# Patient Record
Sex: Female | Born: 1985 | Hispanic: Yes | Marital: Single | Smoking: Never smoker
Health system: Southern US, Community
[De-identification: ages and names within clinical notes are randomized; demographics above are authoritative.]

## PROBLEM LIST (undated history)

## (undated) DIAGNOSIS — Z8759 Personal history of other complications of pregnancy, childbirth and the puerperium: Secondary | ICD-10-CM

## (undated) DIAGNOSIS — E119 Type 2 diabetes mellitus without complications: Secondary | ICD-10-CM

## (undated) DIAGNOSIS — D649 Anemia, unspecified: Secondary | ICD-10-CM

## (undated) DIAGNOSIS — F329 Major depressive disorder, single episode, unspecified: Secondary | ICD-10-CM

## (undated) DIAGNOSIS — F32A Depression, unspecified: Secondary | ICD-10-CM

## (undated) HISTORY — DX: Type 2 diabetes mellitus without complications: E11.9

## (undated) HISTORY — DX: Personal history of other complications of pregnancy, childbirth and the puerperium: Z87.59

## (undated) HISTORY — DX: Depression, unspecified: F32.A

## (undated) HISTORY — DX: Anemia, unspecified: D64.9

## (undated) HISTORY — DX: Major depressive disorder, single episode, unspecified: F32.9

---
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
## 2004-05-28 ENCOUNTER — Inpatient Hospital Stay (HOSPITAL_COMMUNITY): Admission: AD | Admit: 2004-05-28 | Discharge: 2004-05-28 | Payer: Self-pay | Admitting: Family Medicine

## 2004-11-30 IMAGING — US US OB COMP +14 WK
1 series · 13 of 28 positions shown · non-contrast
Comparison: none

CLINICAL DATA: Late pregnancy with no prenatal care.  Uncertain LMP with estimated gestational age of 32 weeks 4 days.

[Series 1: unknown · 0.30mm/px · 13 of 57 slices shown]
[im 3/57]
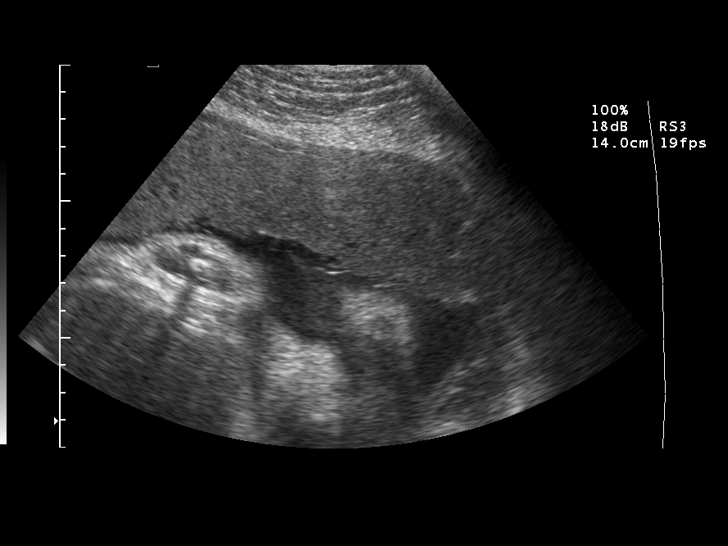
[im 7/57]
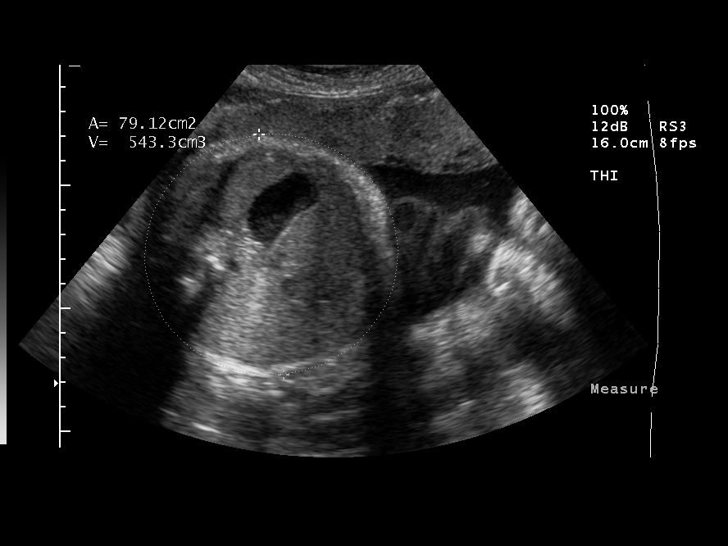
[im 11/57]
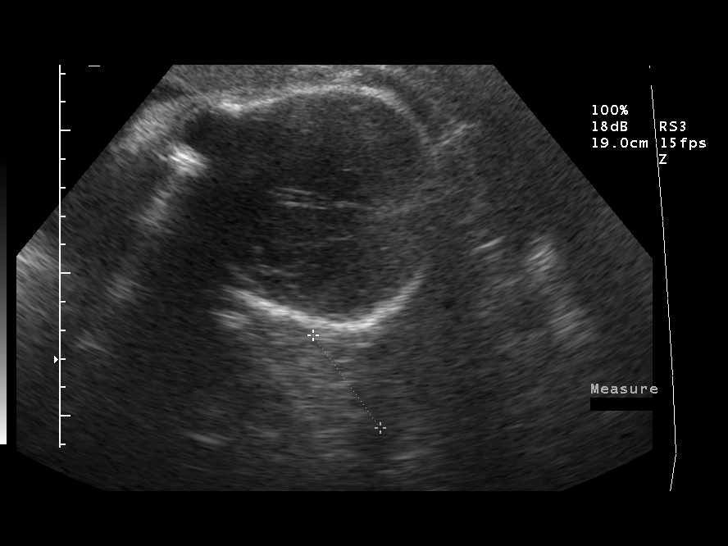
[im 15/57]
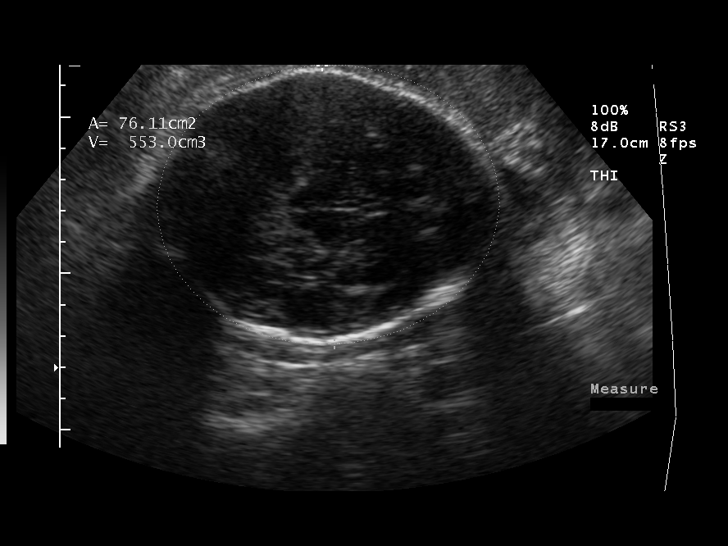
[im 19/57]
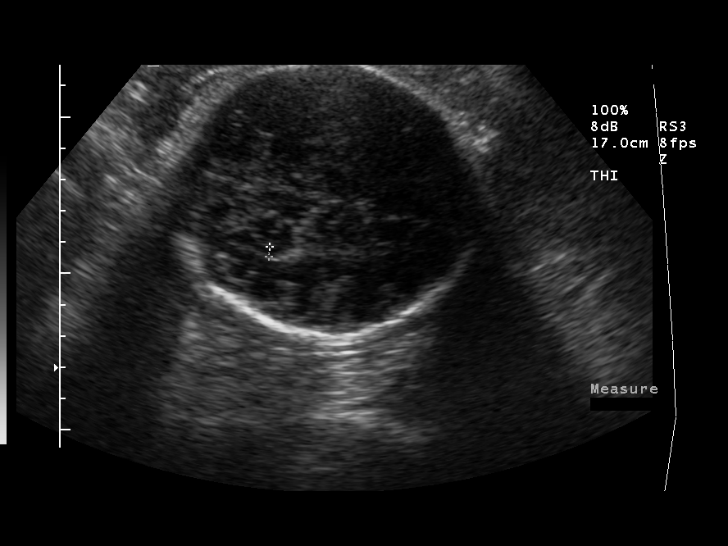
[im 23/57]
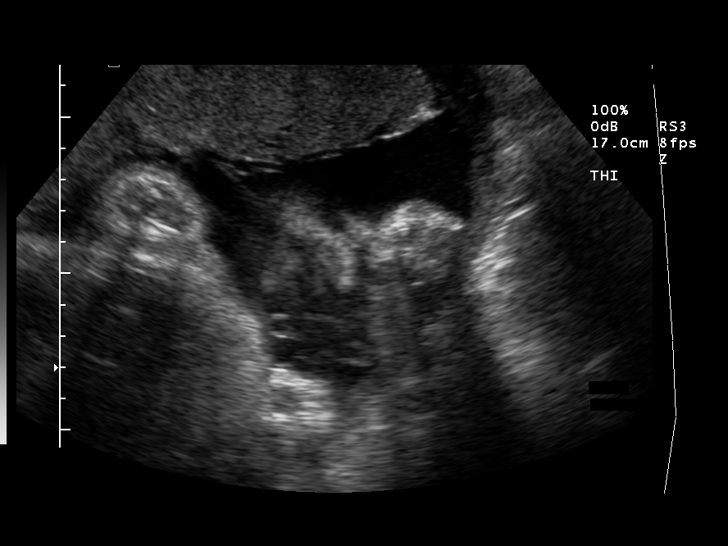
[im 30/57]
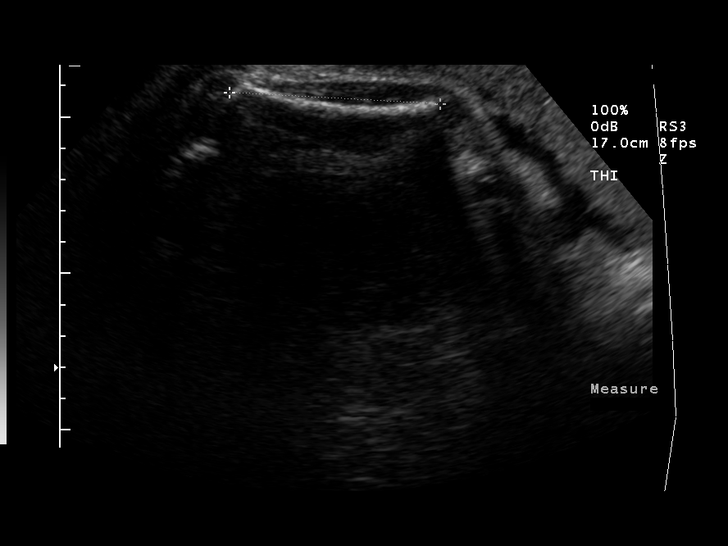
[im 34/57]
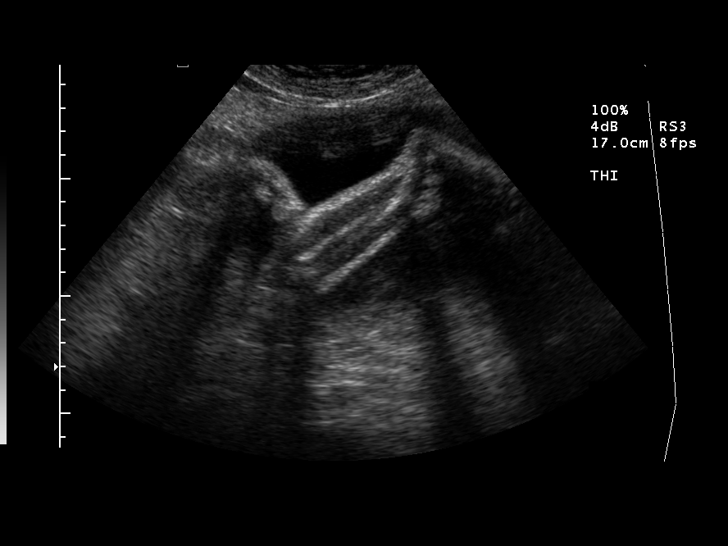
[im 38/57]
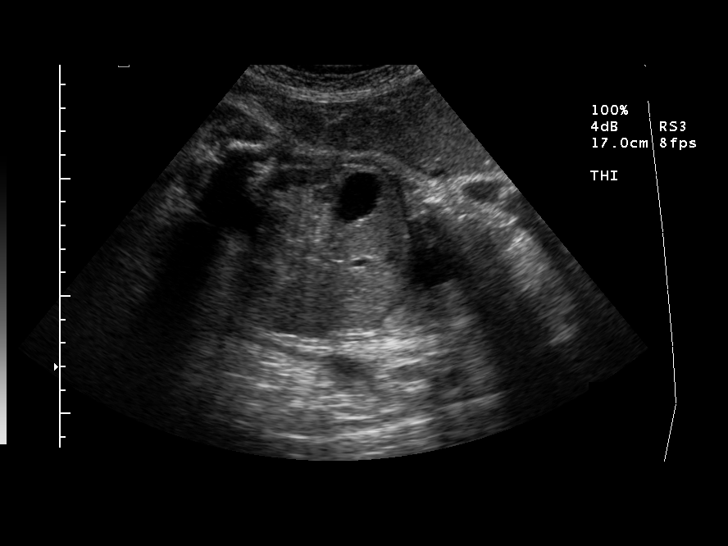
[im 42/57]
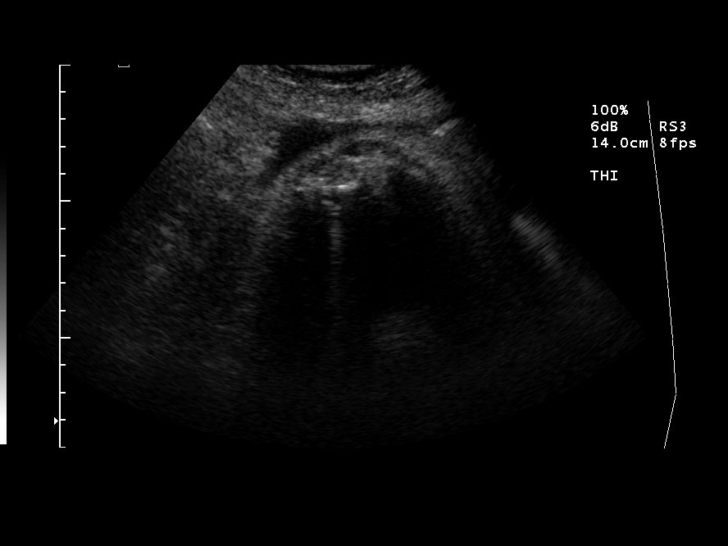
[im 46/57]
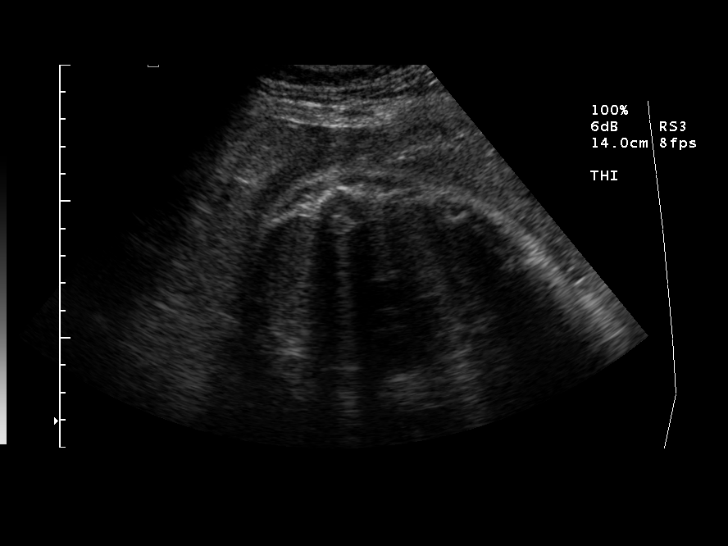
[im 50/57]
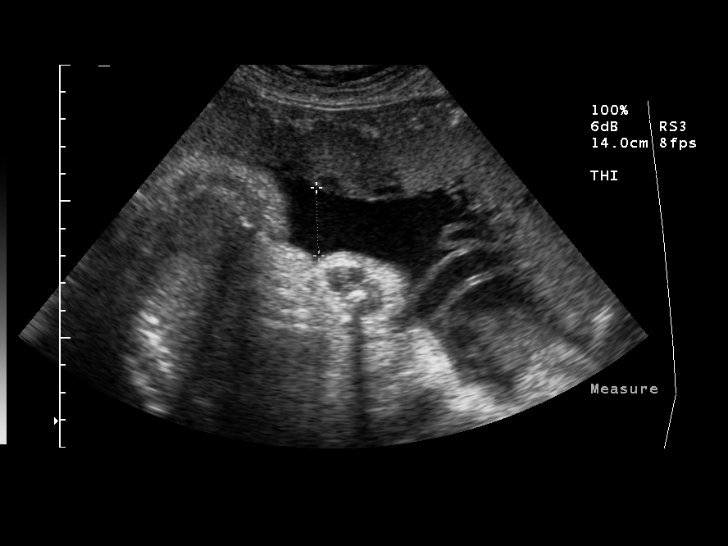
[im 54/57]
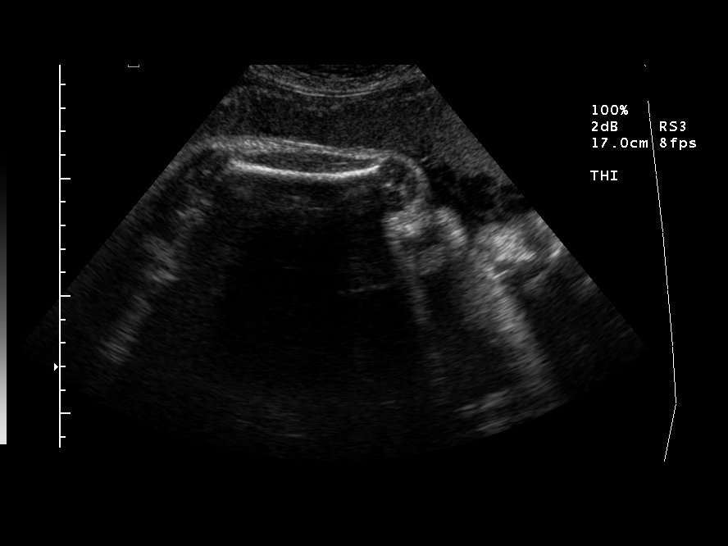

[13 of 28 positions shown; findings below may reference images not displayed]

OBSTETRICAL ULTRASOUND

 NUMBER OF FETUSES:  1
 HEART RATE:  155
 MOVEMENT:  Yes
 BREATHING:  Yes  
 PRESENTATION:  Cephalic
 PLACENTAL LOCATION:  Anterior
 GRADE:  I
 PREVIA:  No
 AMNIOTIC FLUID (SUBJECTIVE):  Normal
 AMNIOTIC FLUID (OBJECTIVE):  10.4 cm AFI (5th - 95th%ile =  7.9 ? 24.9 cm for 35 wks)

 FETAL BIOMETRY
 BPD:  8.6 cm   34 w 6 d
 HC:  31.3 cm  35 w 0 d
 AC:  31.3 cm   35 w 1 d
 FL:  6.7 cm   34 w 4 d
 MEAN GA:    34 w 6 d
 EFW:  0881 g (H) 50th ? 75th%ile (9868 ? 8558 g) for 35 wks

 FETAL ANATOMY
 LATERAL VENTRICLES:  Visualized 
 THALAMI/CSP:      Visualized 
 POSTERIOR FOSSA:  Visualized 
 NUCHAL REGION:  N/A
 SPINE:      Visualized 
 4 CHAMBER HEART ON LEFT:      Not visualized 
 STOMACH ON LEFT:      Visualized 
 3 VESSEL CORD:  Visualized 
 CORD INSERTION SITE:  Not visualized 
 KIDNEYS:  Visualized 
 BLADDER:  Visualized 
 EXTREMITIES:      Visualized except for right upper extremity

 ADDITIONAL ANATOMY VISUALIZED:  Upper lip, orbits, profile, and diaphragm

 EVALUATION LIMITED BY:  Fetal position and advanced gestational age 

 MATERNAL FINDINGS
 CERVIX:  4.0 cm Transabdominally
IMPRESSION: Single living intrauterine fetus with mean gestational age of 34 weeks 6 days and sonographic EDC of 04/14/03.  Initial estimates of gestational age in the third trimester are less accurate.
 Limited anatomic evaluation due to advanced gestational age.  No fetal anatomic abnormality identified.

## 2004-12-29 IMAGING — US US OB FOLLOW-UP
1 series · 18 of 28 positions shown · non-contrast
Comparison: none

CLINICAL DATA: Discordant LMP and prior ultrasound dates.  Assigned gestational age based on prior ultrasound is currently 39 weeks 0 days.  Evaluate fetal growth.

[Series 1: us ob re-eval · 18 of 31 slices shown]
[im 1/31]
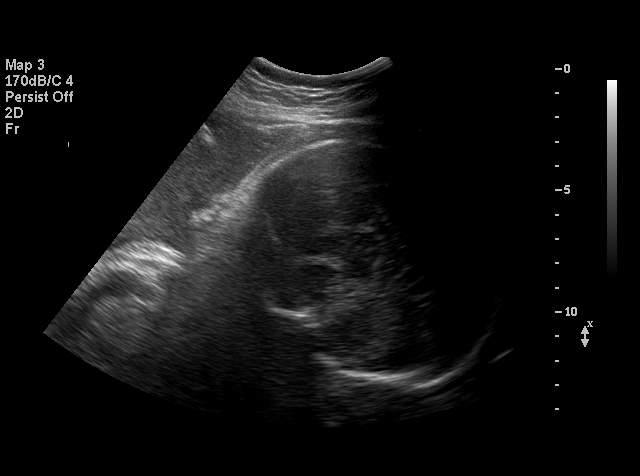
[im 3/31]
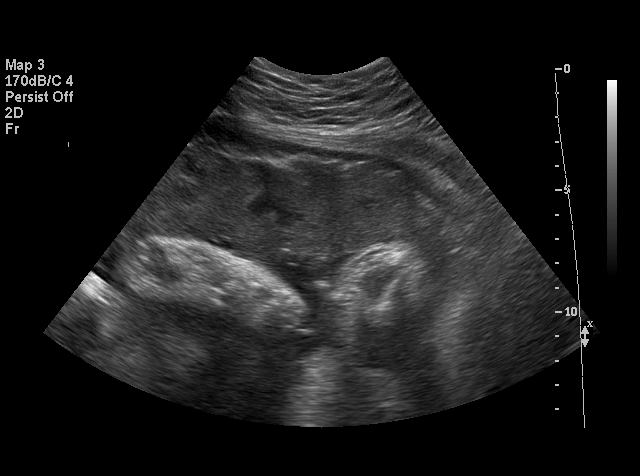
[im 4/31]
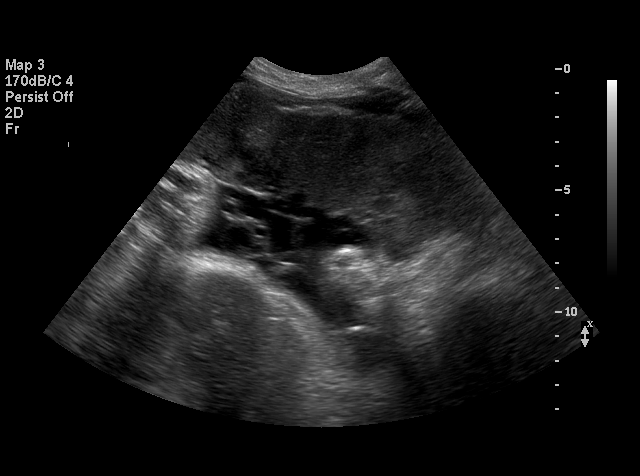
[im 6/31]
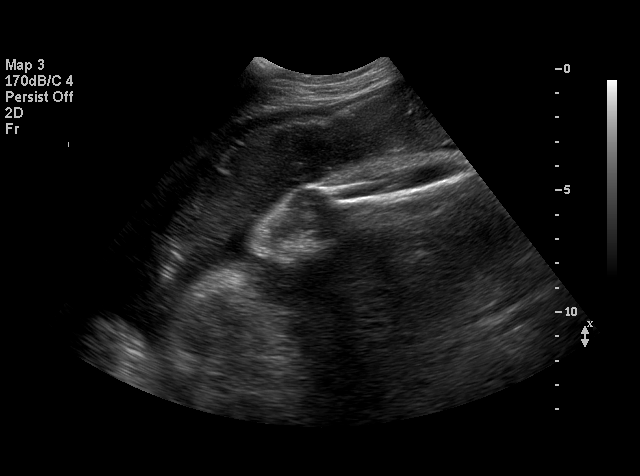
[im 8/31]
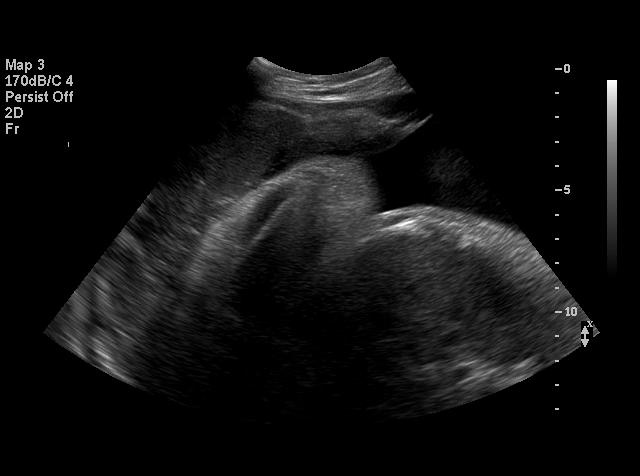
[im 9/31]
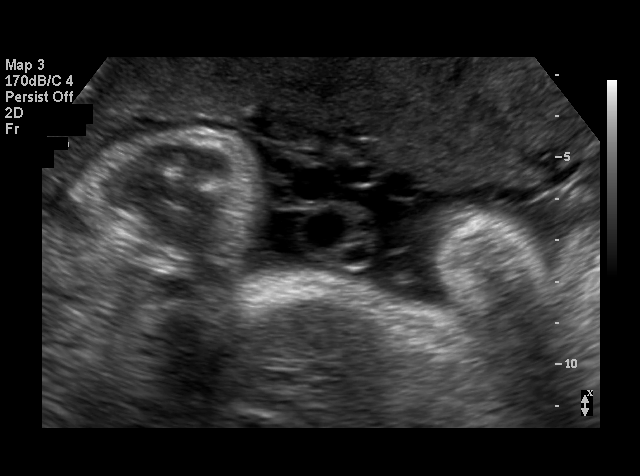
[im 12/31]
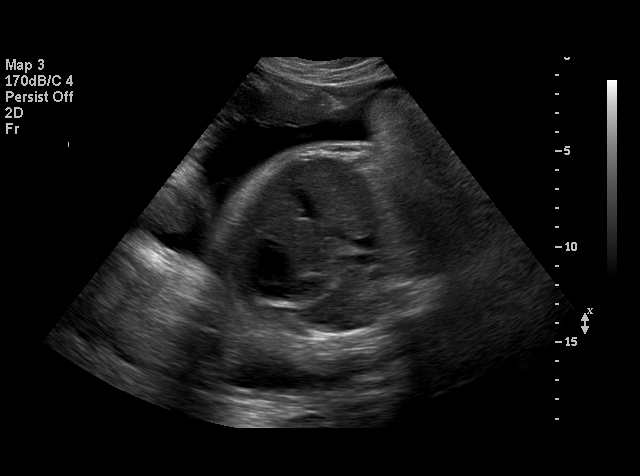
[im 13/31]
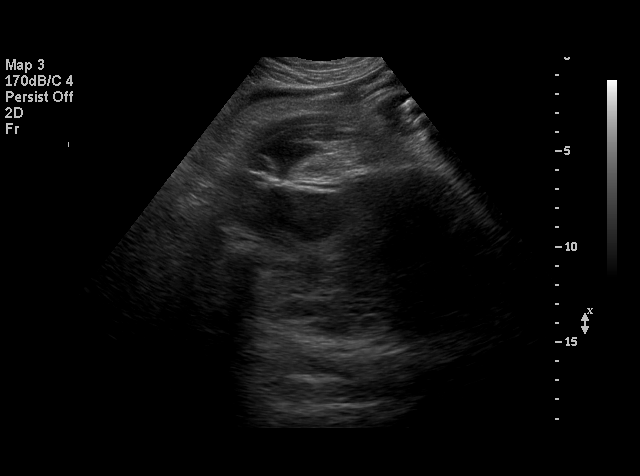
[im 15/31]
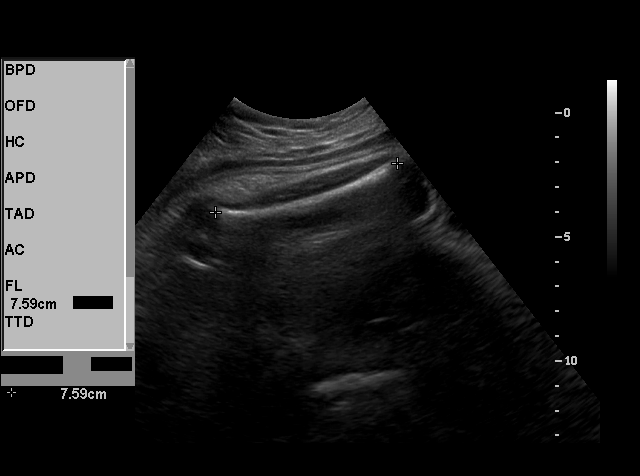
[im 16/31]
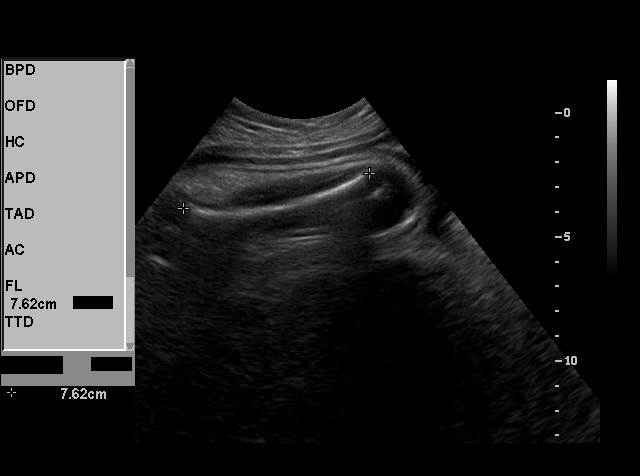
[im 18/31]
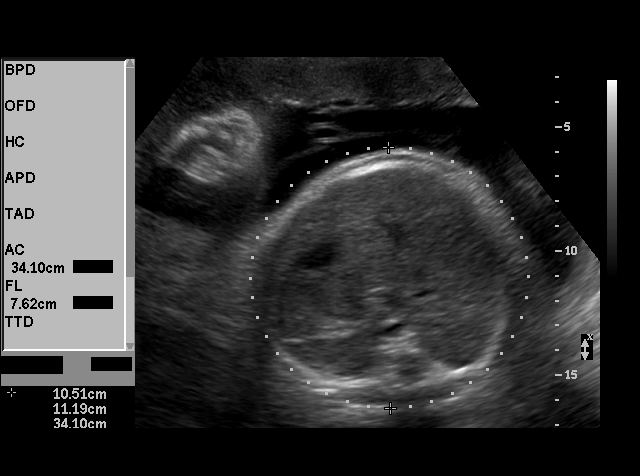
[im 19/31]
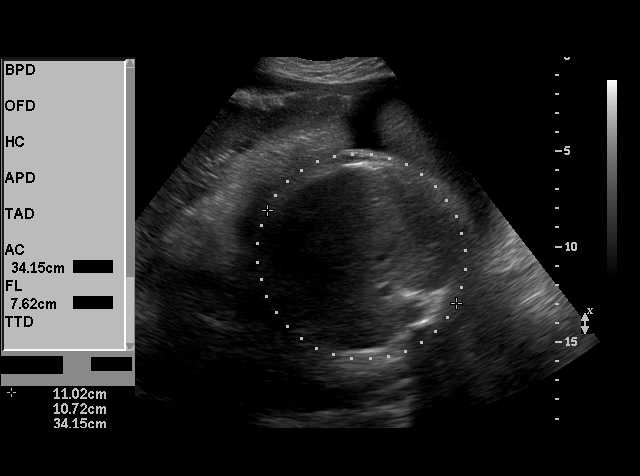
[im 22/31]
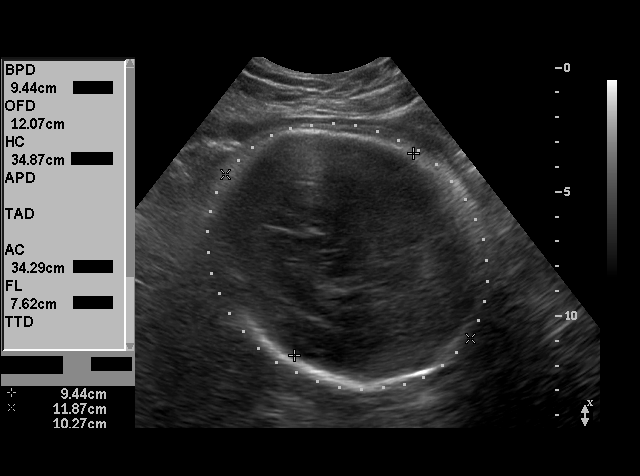
[im 24/31]
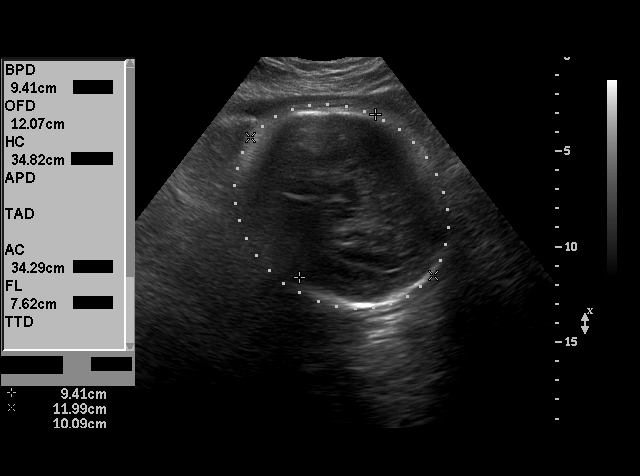
[im 25/31]
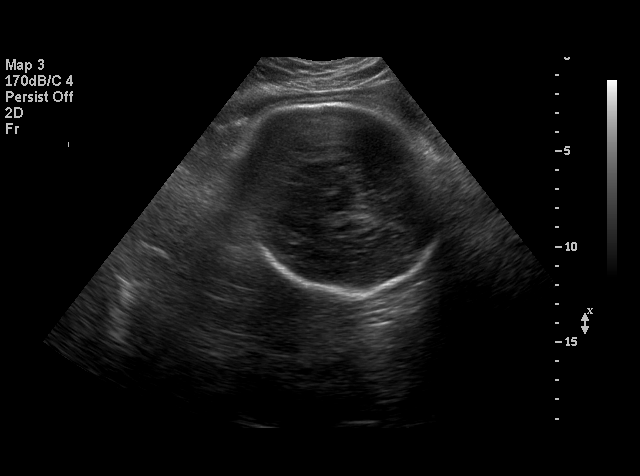
[im 27/31]
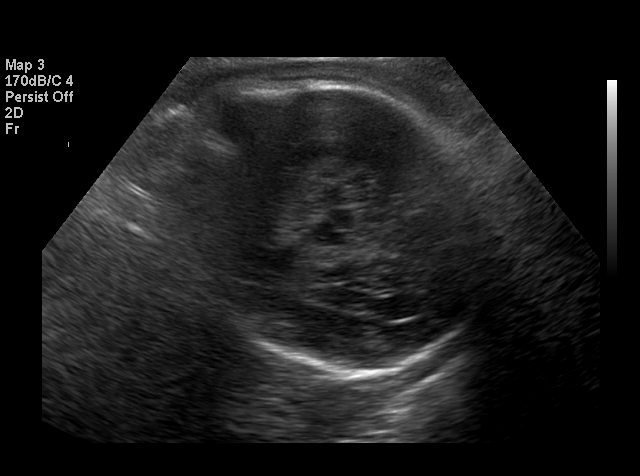
[im 28/31]
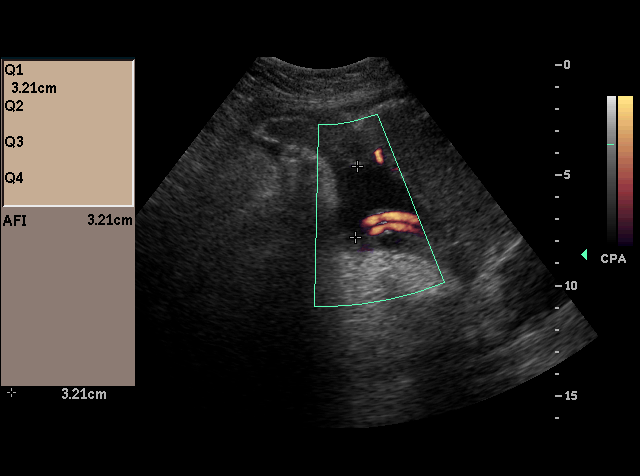
[im 31/31]
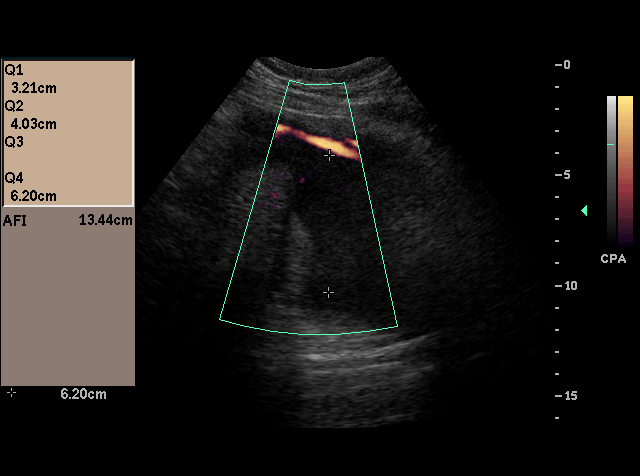

[18 of 28 positions shown; findings below may reference images not displayed]

OBSTETRICAL ULTRASOUND RE-EVALUATION
Number of Fetuses:  1
Heart Rate:  133
Movement:  Yes
Breathing:  Yes
Presentation:  Cephalic
Placental Location:  Anterior
Grade:   II
Previa:  No
Amniotic Fluid (subjective):  Normal
Amniotic Fluid (objective):  13.4 cm AFI (5th -95th%ile =   7.2 ? 22.6 cm for 39 wks)

FETAL BIOMETRY
BPD:  9.4 cm   38 w 3 d
HC:  34.9 cm   40 w 4 d
AC:  34.3 cm   38 w 2 d
FL:  7.6 cm   39 w 0 d

Mean GA:  39 w 1 d
Assigned GA:  39 w 0 d

EFW:  7738 g (S) 50th ? 75th%ile (0188 ? 7200 g) for 39 wks

FETAL ANATOMY
Lateral Ventricles:  Previously seen 
Thalami/CSP:  Previously seen 
Posterior Fossa:  Previously seen 
Nuchal Region:  N/A
Spine:  Previously seen 
4 Chamber Heart on Left:  Not visualized 
Stomach on Left:  Visualized 
3 Vessel Cord:  Visualized 
Cord Insertion Site:  Not visualized 
Kidneys:  Previously seen 
Bladder:  Visualized 
Extremities:  Previously limited
MATERNAL FINDINGS
Cervix:  Not evaluated
IMPRESSION: Assigned gestational age by prior ultrasound is currently 39 weeks 0 days.  There has been appropriate fetal growth, with mean gestational age on today?s study at 39 weeks 1 day and estimated fetal weight between the 50th and 75th percentiles.
Normal amniotic fluid volume.

## 2005-01-01 ENCOUNTER — Inpatient Hospital Stay (HOSPITAL_COMMUNITY): Admission: RE | Admit: 2005-01-01 | Discharge: 2005-01-04 | Payer: Self-pay | Admitting: Obstetrics & Gynecology

## 2005-01-01 DIAGNOSIS — O24419 Gestational diabetes mellitus in pregnancy, unspecified control: Secondary | ICD-10-CM

## 2006-02-20 IMAGING — US US OB COMP LESS 14 WK
1 series · 18 of 18 positions shown · non-contrast
Comparison: none

CLINICAL DATA: 8 week 6 day gestational age by LMP.  Left-sided pelvic pain.
 OBSTETRICAL ULTRASOUND WITH TRANSVAGINAL:
 A single living intrauterine gestation is seen with measured heart rate of 176.  Embryonic crown rump length measures 2.1 cm, corresponding to the gestational age of 8 weeks 5 days.  A normal appearing yolk sac is seen.  There is no evidence of subchorionic hemorrhage or other maternal uterine abnormality.  
 The left ovary contains a corpus luteum cyst measuring approximately 2 cm.  The right ovary is normal in appearance.  No adnexal masses or free fluid are identified by either transabdominal or transvaginal sonography.

[Series 1: us ob comp less 14 wks · 18 of 18 slices shown]
[im 1/18]
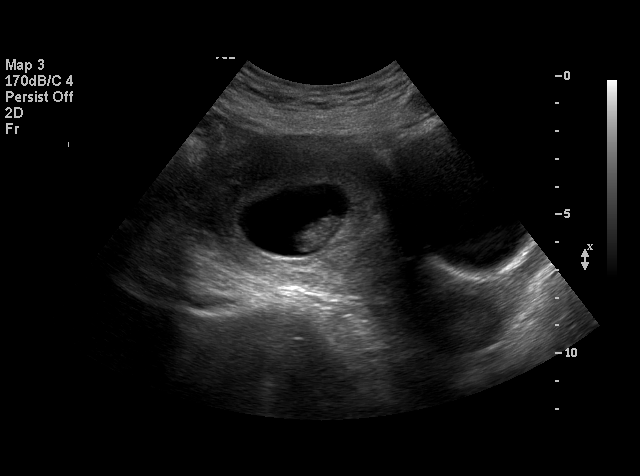
[im 2/18]
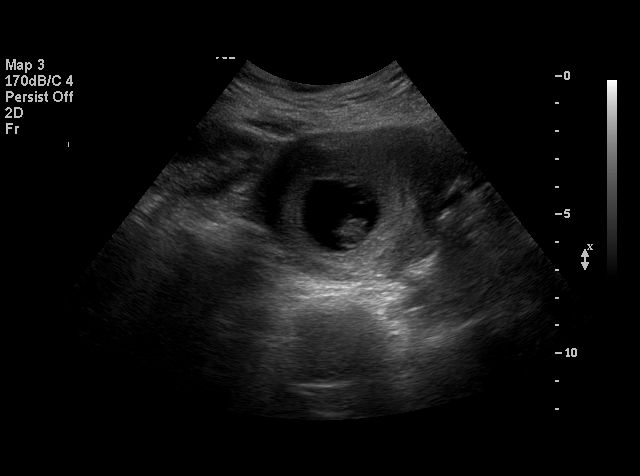
[im 3/18]
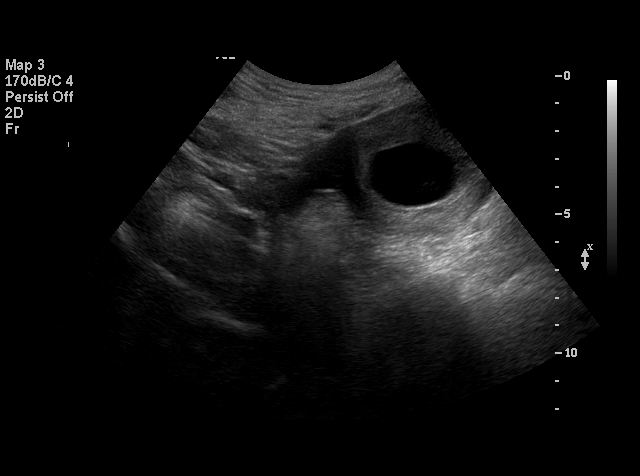
[im 4/18]
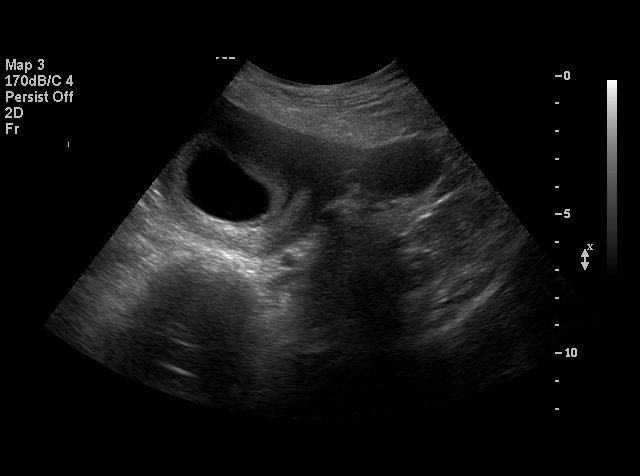
[im 5/18]
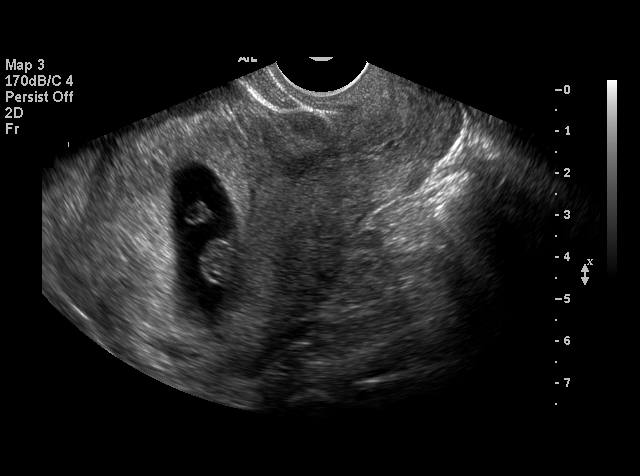
[im 6/18]
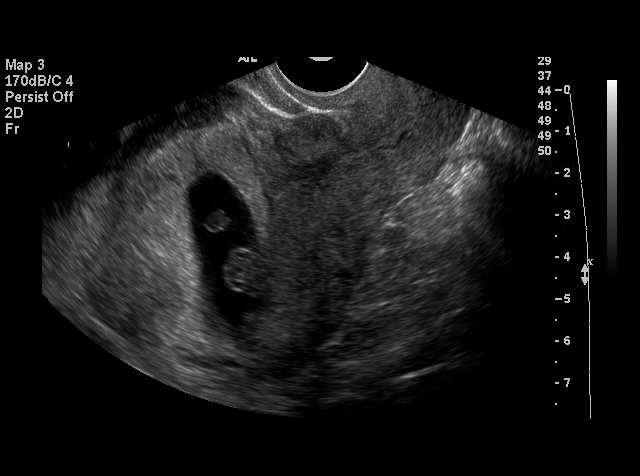
[im 7/18]
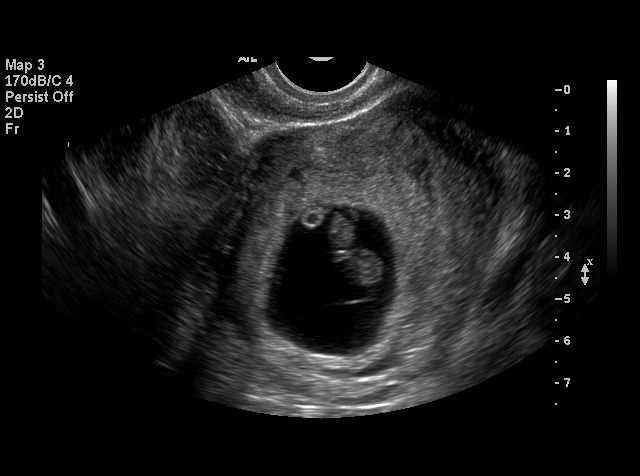
[im 8/18]
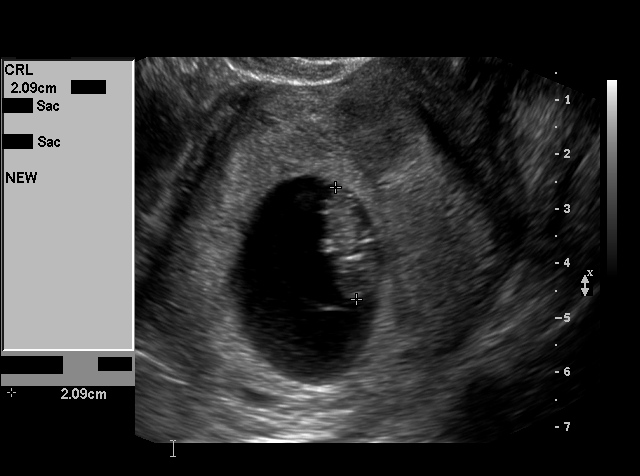
[im 9/18]
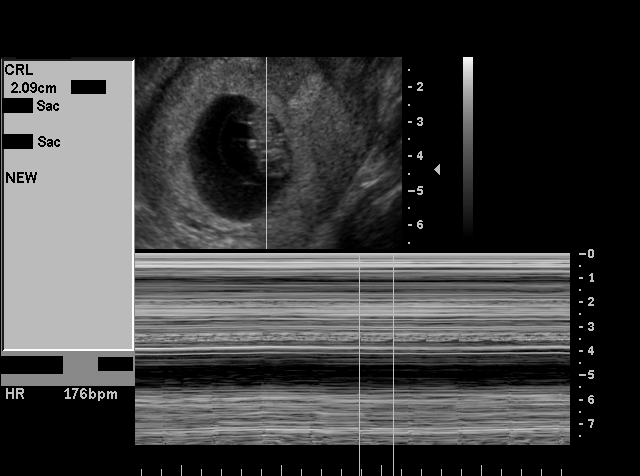
[im 10/18]
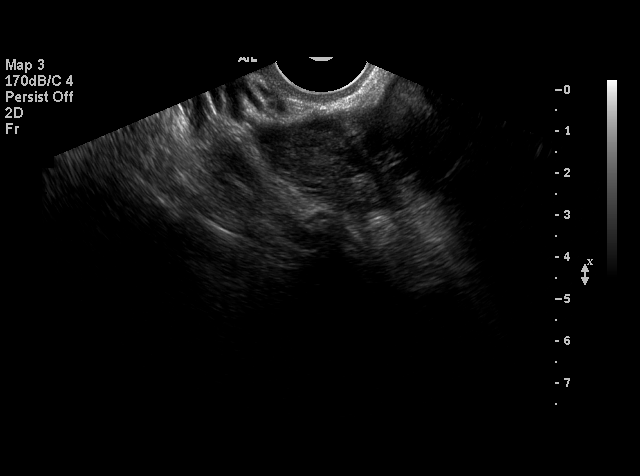
[im 11/18]
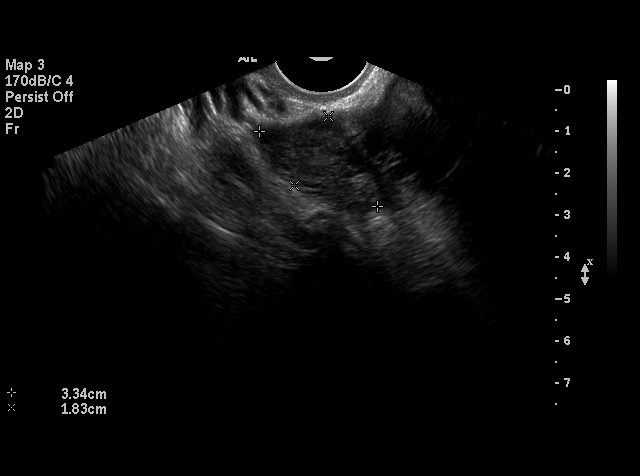
[im 12/18]
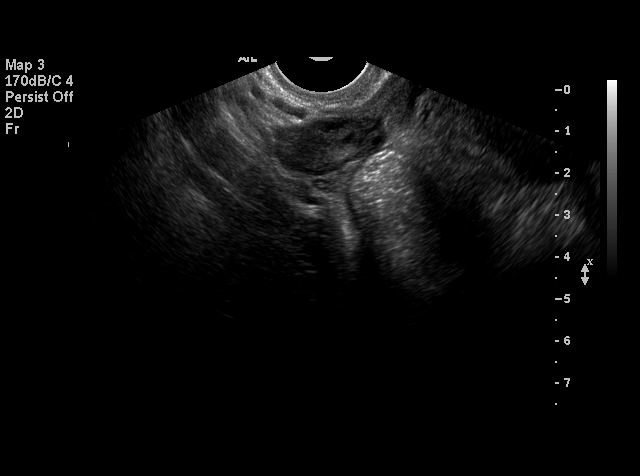
[im 13/18]
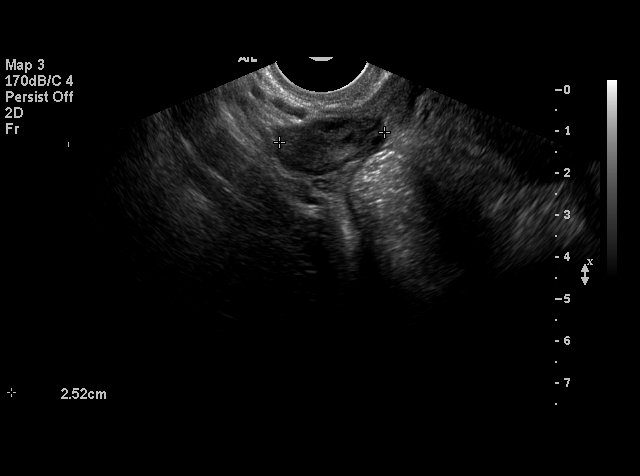
[im 14/18]
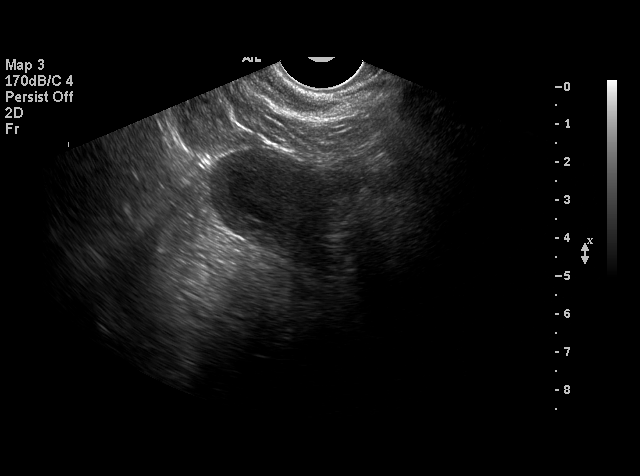
[im 15/18]
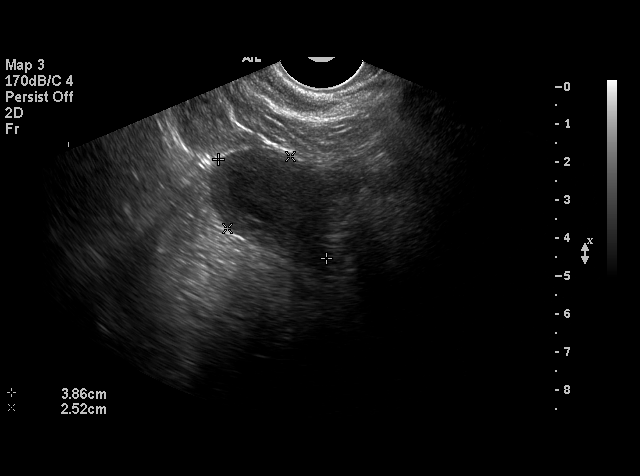
[im 16/18]
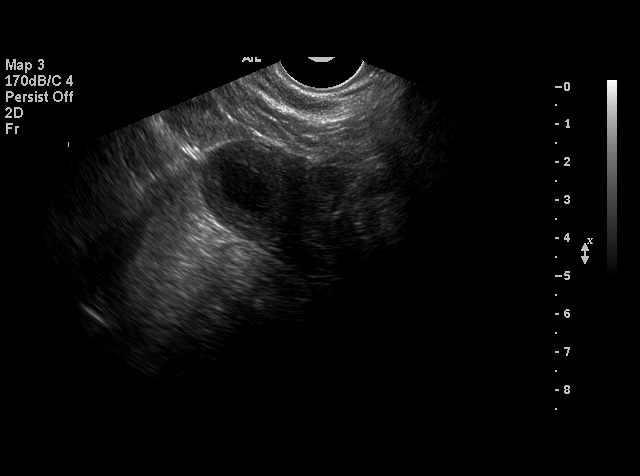
[im 17/18]
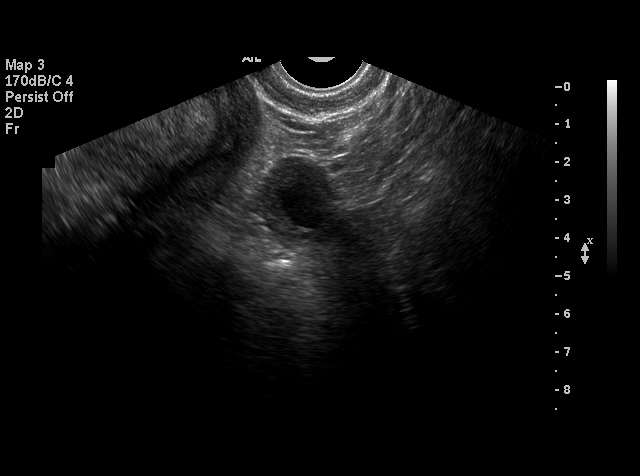
[im 18/18]
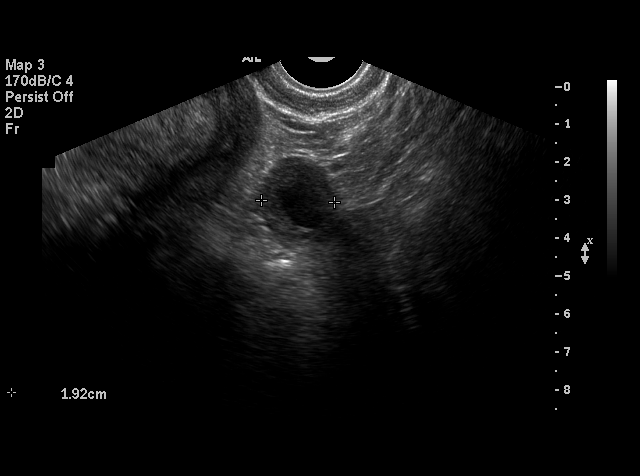

[18 of 18 positions shown; findings below may reference images not displayed]

IMPRESSION: 1.  Single living intrauterine gestation with estimated gestational age of 8 weeks 5 days and sonographic EDC of 01/03/05.  This correlates closely with LMP dates.  
 2.  2 cm left ovarian corpus luteum.  No evidence of adnexal mass or free fluid.

## 2009-07-18 ENCOUNTER — Ambulatory Visit (HOSPITAL_COMMUNITY): Admission: RE | Admit: 2009-07-18 | Discharge: 2009-07-18 | Payer: Self-pay | Admitting: Obstetrics & Gynecology

## 2009-10-03 ENCOUNTER — Inpatient Hospital Stay (HOSPITAL_COMMUNITY): Admission: RE | Admit: 2009-10-03 | Discharge: 2009-10-05 | Payer: Self-pay | Admitting: Obstetrics & Gynecology

## 2009-10-03 ENCOUNTER — Ambulatory Visit: Payer: Self-pay | Admitting: Obstetrics & Gynecology

## 2009-10-07 ENCOUNTER — Ambulatory Visit: Payer: Self-pay | Admitting: Advanced Practice Midwife

## 2009-10-07 ENCOUNTER — Inpatient Hospital Stay (HOSPITAL_COMMUNITY)
Admission: AD | Admit: 2009-10-07 | Discharge: 2009-10-07 | Payer: Self-pay | Source: Home / Self Care | Admitting: Obstetrics & Gynecology

## 2010-05-27 LAB — COMPREHENSIVE METABOLIC PANEL
ALT: 49 U/L — ABNORMAL HIGH (ref 0–35)
AST: 52 U/L — ABNORMAL HIGH (ref 0–37)
Albumin: 2.8 g/dL — ABNORMAL LOW (ref 3.5–5.2)
Alkaline Phosphatase: 153 U/L — ABNORMAL HIGH (ref 39–117)
BUN: 11 mg/dL (ref 6–23)
CO2: 24 mEq/L (ref 19–32)
Calcium: 8.7 mg/dL (ref 8.4–10.5)
Chloride: 104 mEq/L (ref 96–112)
Creatinine, Ser: 0.67 mg/dL (ref 0.4–1.2)
GFR calc Af Amer: >60 mL/min (ref >60)
GFR calc non Af Amer: >60 mL/min (ref >60)
Glucose, Bld: 81 mg/dL (ref 70–99)
Potassium: 3.5 mEq/L (ref 3.5–5.1)
Sodium: 135 mEq/L (ref 135–145)
Total Bilirubin: 0.6 mg/dL (ref 0.3–1.2)
Total Protein: 6 g/dL (ref 6.0–8.3)

## 2010-05-27 LAB — URINALYSIS, ROUTINE W REFLEX MICROSCOPIC
Bilirubin Urine: NEGATIVE
Bilirubin Urine: NEGATIVE
Glucose, UA: NEGATIVE mg/dL
Glucose, UA: NEGATIVE mg/dL
Hgb urine dipstick: NEGATIVE
Ketones, ur: NEGATIVE mg/dL
Ketones, ur: NEGATIVE mg/dL
Nitrite: NEGATIVE
Nitrite: NEGATIVE
Protein, ur: NEGATIVE mg/dL
Protein, ur: NEGATIVE mg/dL
Specific Gravity, Urine: 1.015 (ref 1.005–1.030)
Specific Gravity, Urine: 1.02 (ref 1.005–1.030)
Urobilinogen, UA: 0.2 mg/dL (ref 0.0–1.0)
Urobilinogen, UA: 1 mg/dL (ref 0.0–1.0)
pH: 6 (ref 5.0–8.0)
pH: 6.5 (ref 5.0–8.0)

## 2010-05-27 LAB — CBC
HCT: 25.5 % — ABNORMAL LOW (ref 36.0–46.0)
HCT: 25.9 % — ABNORMAL LOW (ref 36.0–46.0)
HCT: 31.2 % — ABNORMAL LOW (ref 36.0–46.0)
Hemoglobin: 10.8 g/dL — ABNORMAL LOW (ref 12.0–15.0)
Hemoglobin: 8.9 g/dL — ABNORMAL LOW (ref 12.0–15.0)
Hemoglobin: 8.9 g/dL — ABNORMAL LOW (ref 12.0–15.0)
MCH: 30.1 pg (ref 26.0–34.0)
MCH: 30.4 pg (ref 26.0–34.0)
MCH: 32.1 pg (ref 26.0–34.0)
MCHC: 34.3 g/dL (ref 30.0–36.0)
MCHC: 34.7 g/dL (ref 30.0–36.0)
MCHC: 35.1 g/dL (ref 30.0–36.0)
MCV: 86.9 fL (ref 78.0–100.0)
MCV: 88.5 fL (ref 78.0–100.0)
MCV: 91.3 fL (ref 78.0–100.0)
Platelets: 187 10*3/uL (ref 150–400)
Platelets: 215 10*3/uL (ref 150–400)
Platelets: 336 10*3/uL (ref 150–400)
RBC: 2.79 MIL/uL — ABNORMAL LOW (ref 3.87–5.11)
RBC: 2.92 MIL/uL — ABNORMAL LOW (ref 3.87–5.11)
RBC: 3.59 MIL/uL — ABNORMAL LOW (ref 3.87–5.11)
RDW: 15.2 % (ref 11.5–15.5)
RDW: 15.5 % (ref 11.5–15.5)
RDW: 15.7 % — ABNORMAL HIGH (ref 11.5–15.5)
WBC: 7.9 10*3/uL (ref 4.0–10.5)
WBC: 8.9 10*3/uL (ref 4.0–10.5)
WBC: 9.3 10*3/uL (ref 4.0–10.5)

## 2010-05-27 LAB — ABO/RH: ABO/RH(D): O POS

## 2010-05-27 LAB — URINE MICROSCOPIC-ADD ON

## 2010-05-27 LAB — TYPE AND SCREEN
ABO/RH(D): O POS
Antibody Screen: NEGATIVE

## 2010-05-27 LAB — SURGICAL PCR SCREEN
MRSA, PCR: NEGATIVE
Staphylococcus aureus: NEGATIVE

## 2010-05-27 LAB — GLUCOSE, CAPILLARY: Glucose-Capillary: 105 mg/dL — ABNORMAL HIGH (ref 70–99)

## 2010-05-27 LAB — RPR: RPR Ser Ql: NONREACTIVE

## 2010-07-28 NOTE — Discharge Summary (Signed)
NAME:  SHALECE, STAFFA          ACCOUNT NO.:  [id number]   MEDICAL RECORD NO.:  [id number]          PATIENT TYPE:  INP   LOCATION:  9127                          FACILITY:  [facility]   PHYSICIAN:  Charles A. Clearance Coots, M.D.DATE OF BIRTH:  [birth year]   DATE OF ADMISSION:  01/01/2005  DATE OF DISCHARGE:  01/04/2005                                 DISCHARGE SUMMARY   ADMISSION DIAGNOSES:  1.  Thirty-nine weeks' gestation.  2.  Elective repeat cesarean delivery.   DISCHARGE DIAGNOSES:  1.  Thirty-nine weeks' gestation.  2.  Elective repeat cesarean delivery.  3.  Status post elective repeat cesarean delivery at 39 weeks' gestation.  4.  Large for gestational age infant.   Delivered a viable female infant on January 01, 2005, Apgars of 9 at one  minute and 9 at five minutes, weight of 4685 g, length of 51 cm.  Mother and  infant discharged home in good condition.   REASON FOR ADMISSION:  A 25 year old Hispanic female, estimated date of  confinement of January 08, 2005, presents for elective repeat cesarean  delivery.   PAST MEDICAL HISTORY:  Surgery:  Cesarean section.   Illnesses:  None.   MEDICATIONS:  Prenatal vitamins.   ALLERGIES:  No known drug allergies.   SOCIAL HISTORY:  Married.  Negative for tobacco, alcohol or recreational  drug use.   PHYSICAL EXAMINATION:  GENERAL:  A well-nourished, well-developed female in  no acute distress.  VITAL SIGNS:  Afebrile, vital signs are stable.  LUNGS:  Clear to auscultation bilaterally.  CARDIAC:  Regular rate and rhythm.  ABDOMEN:  Gravid, nontender.  PELVIC:  Cervical exam was omitted.   ADMITTING LABORATORY VALUES:  Hemoglobin 11.9, hematocrit 35.4, white blood  cell count 7800, platelets 234,000.   HOSPITAL COURSE:  The patient underwent repeat low transverse cesarean  section on January 01, 2005.  There were no intraoperative complications.  Postoperative course was uncomplicated.  The patient was discharged home on  postop  day #3 in good condition.   DISCHARGE LABORATORY VALUES:  Hemoglobin 9.5, hematocrit 28.6, white blood  cell count 11,500, platelets 228,000.   DISCHARGE DISPOSITION:  Tylox and ibuprofen were prescribed for pain.  Continue prenatal vitamins.  Routine written instructions were given for  delivery by cesarean section.   DISCHARGE INSTRUCTIONS:  The patient is to call the office for a follow-up  appointment in two weeks.      Charles A. Clearance Coots, M.D.  Electronically Signed     CAH/MEDQ  D:  02/02/2005  T:  02/02/2005  Job:  [id number]

## 2010-07-28 NOTE — Op Note (Signed)
NAME:  Robin Cortez, Robin Cortez                      ACCOUNT NO.:  [id number]   MEDICAL RECORD NO.:  [id number]                   PATIENT TYPE:  INP   LOCATION:  9145                                 FACILITY:  [facility]   PHYSICIAN:  Lesly Dukes, M.D.              DATE OF BIRTH:  05-25-1985   DATE OF PROCEDURE:  04/29/2003  DATE OF DISCHARGE:                                 OPERATIVE REPORT   PREOPERATIVE DIAGNOSIS:  An 25 year old G1, P0, at 71 weeks' estimated  gestational age with macrosomia and no presenting part.   POSTOPERATIVE DIAGNOSIS:  An 25 year old G1, P0, at 57 weeks' estimated  gestational age with macrosomia and no presenting part.   PROCEDURE:  Primary low flap transverse cesarean section.   SURGEON:  Lesly Dukes, M.D.   ASSISTANT:  Conni Elliot, M.D.   ESTIMATED BLOOD LOSS:  800 mL.   URINE OUTPUT:  300 mL clear.   FLUIDS REPLACED:  4000 mL.   COMPLICATIONS:  None.   PATHOLOGY:  Placenta.   FINDINGS:  A viable female infant, Apgars 8 at one minute, 9 at five  minutes, 9 pound 9 ounce weight, vertex, non-engaged head, polyhydramnios,  and clear fluid.  Uterine atony requiring Hemabate x1, Methergine x1, and  Cytotec x800 mcg, normal fallopian tubes and ovaries bilaterally, normal  uterus, pediatrics at delivery.   DESCRIPTION OF PROCEDURE:  After informed consent was obtained, the patient  was taken to the operating room, where her spinal anesthesia was found to be  adequate.  The patient was placed in the dorsal supine position, a Foley was  placed in the bladder, and the abdomen was prepared and draped in the normal  sterile fashion.  A Pfannenstiel skin incision was made with a scalpel and  carried down to the underlying layer of fascia with the Bovie.  The fascia  was incised in the midline and extended bilaterally.  Superior and inferior  aspects of the fascial incision were grasped with Kocher clamps, tented up,  and dissected off sharply and  bluntly from the underlying layers of rectus  muscles.  Rectus muscles were separated in the midline.  The peritoneum was  identified, tented up, and entered sharply using Metzenbaum scissors.  This  incision was extended both superiorly and inferiorly with good visualization  of the bladder.  The bladder blade was inserted, the vesicouterine  peritoneum was identified, tented up, entered sharply with the Metzenbaum  scissors.  This incision was extended bilaterally.  The bladder flap was  then created digitally.  The bladder blade was reinserted.  The uterus was  incised in a transverse fashion in the lower uterine segment with a scalpel.  This incision was extended bilaterally with the bandage scissors.  The above  findings were noted.  The baby's head delivered without incident.  Nose and  mouth were suctioned.  The cord was clamped and cut and the baby was handed  off to the awaiting pediatricians.  Cord blood was sent for type and screen.  The placenta delivered spontaneously with three-vessel cord.  The uterus was  exteriorized and cleared of all clots and debris.  The uterine incision was  closed with 0 Vicryl in a running locked fashion.  Good hemostasis was  noted; however, the uterus was still somewhat  atonic after a Pitocin bolus.  Hemabate and Methergine were given IM.  This aided in uterine contraction.  The uterus was returned to the abdomen and noted to be hemostasis off  tension.  The peritoneum was closed with 0 Vicryl in a running fashion.  Good hemostasis was noted from the rectus muscles and the fascia.  The  fascia was closed with 0 Vicryl in a running fashion, the subcutaneous  tissue was copiously irrigated, and the skin was closed with staples.  The  patient tolerated the procedure well.  The sponge, lap, instrument, and  needle count were correct x2.  At the end of the procedure 800 mcg of  Cytotec were placed rectally.  The patient went to the recovery room in   stable condition.                                               Lesly Dukes, M.D.    Lora Paula  D:  04/29/2003  T:  04/29/2003  Job:  [id number]

## 2010-07-28 NOTE — Op Note (Signed)
NAME:  Robin Cortez, Robin Cortez          ACCOUNT NO.:  [id number]   MEDICAL RECORD NO.:  [id number]          PATIENT TYPE:  INP   LOCATION:  9199                          [facility]ACILITY:  [facility]   PHYSICIAN:  Roseanna Rainbow, M.D.DATE OF BIRTH:  May 28, 1985   DATE OF PROCEDURE:  01/01/2005  DATE OF DISCHARGE:                                 OPERATIVE REPORT   PREOPERATIVE DIAGNOSIS:  Intrauterine pregnancy at term, history of previous  cesarean delivery, keloid of previous Pfannenstiel scar.   POSTOPERATIVE DIAGNOSIS:  Intrauterine pregnancy at term, history of  previous cesarean delivery, keloid of previous Pfannenstiel scar, large for  gestational age infant.   PROCEDURE:  Repeat cesarean delivery and revision of a previous Pfannenstiel  keloid scar.   SURGEON:  Roseanna Rainbow, M.D.   ASSISTANT:  Kathreen Cosier, M.D.   ANESTHESIA:  Spinal.   ESTIMATED BLOOD LOSS:  600 mL.   COMPLICATIONS:  None.   PROCEDURE:  The patient was taken to the operating room and spinal  anesthetic was administered without difficulty. She was then placed in the  dorsal supine position with a leftward tilt and prepped and draped in the  usual sterile fashion. The previous Pfannenstiel's keloid scar was then  excised with the scalpel. The incision was then carried down to the  underlying fascia with the Bovie. The fascia was nicked in the midline. The  fascial incision was then extended bilaterally with curved Mayo scissors.  The superior aspect of the fascial incision was then tented up with Kocher  clamps and the underlying rectus muscles dissected off. The inferior aspect  of the fascial incision was then manipulated in a similar fashion. The  rectus muscles were separated in the midline. The parietal peritoneum was  tented up and entered sharply. The peritoneal incision was then extended  superiorly and inferiorly with good visualization of the bladder. Bladder  blade was then placed. The  vesicouterine peritoneum was tented up and  entered sharply with Metzenbaum scissors. The peritoneal incision was  extended bilaterally and the bladder flap created sharply. The bladder blade  was then replaced. The lower uterine segment was incised in a transverse  fashion with the scalpel. The uterine incision was then extended bluntly.  The infant's head was delivered atraumatically. The oropharynx was suctioned  with bulb suction and the cord was clamped and cut. The infant was then  handed off to the awaiting neonatologist. The patient was delivered of a  live born female. Weight was 10 pounds 5 ounces. The placenta was then  removed. The uterus exteriorized. The intrauterine cavity was evacuated of  any remaining amniotic fluid, clots and debris with moist laparotomy  sponges. The uterine incision was then reapproximated in a running  interlocking fashion. A second imbricating layer using a suture of the same  was then placed. Adequate hemostasis was noted. The uterus was returned to  the abdomen. The paracolic gutters were then copiously irrigated. The  parietal peritoneum was reapproximated in a running fashion using 2-0  Vicryl. The fascia was reapproximated with 0 PDS in a running fashion. The  skin was reapproximated in a  subcuticular fashion using 3-0 Monocryl. The  subcutaneous layer was then infiltrated with a diluted betamethasone  Marcaine solution. Total of 10 mL was used. At the close of the procedure  the instrument and pack counts were said to be correct x2. 1 gram of  cephazolin was given at cord clamp. The patient was taken to the PACU awake  and in stable condition.      Roseanna Rainbow, M.D.  Electronically Signed     LAJ/MEDQ  D:  01/01/2005  T:  01/01/2005  Job:  [id number]

## 2010-07-28 NOTE — Discharge Summary (Signed)
NAME:  Robin Cortez, Robin Cortez                      ACCOUNT NO.:  [id number]   MEDICAL RECORD NO.:  [id number]                   PATIENT TYPE:  INP   LOCATION:  9145                                 [facility]ILITY[facility]   PHYSICIAN:  Nilda Simmer, M.D.                  DATE OF BIRTH:  09/08/1985   DATE OF ADMISSION:  04/29/2003  DATE OF DISCHARGE:                                 DISCHARGE SUMMARY   CONSULTATIONS:  None.   PROCEDURE:  Low transverse cesarean section, elective.   DISCHARGE DIAGNOSES:  1. Intrauterine pregnancy at [redacted] weeks gestation.  2. Macrosomia.  3. Status post low transverse cesarean section.  4. Anemia, acute blood loss.  5. Delivery of a viable female infant.   DISCHARGE MEDICATIONS:  1. Percocet 5/325 mg one to two tablets p.o. q.6h p.r.n. strong pain.  2. Ibuprofen 600 mg one p.o. q.6h p.r.n. milder pain.  3. Micronor one p.o. daily for contraception.  4. Iron sulfate 325 mg one p.o. daily for anemia.  5. Prenatal vitamins one tablet p.o. daily while breast feeding.   FOLLOW UP:  The patient will follow-up at [facility] in six weeks for  postpartum evaluation.   HOSPITAL COURSE:  The patient is an 25 year old Gravida I, Para 0 presenting  at [redacted] weeks gestation to undergo an elective low transverse cesarean section  secondary to macrosomia of her fetus.  A low transverse cesarean section was  performed by Dr. Penne Lash; please see the operative report for further  details.  Delivery of a viable female infant with Apgars of 8 at one minute  and 9 at five minutes and the infant weighed 9 pounds 9 ounces at delivery.  Polyhydramnios was noted.  The patient did suffer uterine atony in the  perioperative period which required Hemabate times one, Methergine times one  and Cytotec times one.  The patient did well in the postoperative period  with stable postpartum bleeding.  The patient was noted to be tachycardic in  the postoperative period.  However, orthostatics were  within normal limits  and hemoglobin was stable at 8.9 on discharge.  The patient was breast and  bottle feeding the infant without difficulty.  The patient requested  Micronor for contraception.  Staples are removed on the day of discharge  with good wound healing noted.   DISCHARGE LABORATORIES:  White blood cell count was 8.9, hemoglobin 8.9,  hematocrit 26.1, platelets 285,000.  Blood type is O positive. Antibody is  negative.  RPR is non-reactive.   DISCHARGE INSTRUCTIONS:  1. Wound care - the patient is to leave the Steri-Strips in place until they     fall off and to have the wound cleaned with water for several days.  2. Diet at home is regular.  3. Activity - the patient is instructed in no heavy lifting for the next six     to eight weeks.  4. Sexual activity -  nothing per vagina for the next six weeks.  5. The patient should contact [facility] for the development of fever,     worsening of abdominal pain, increased bleeding, vomiting or any other     concerns.                                               Nilda Simmer, M.D.    KS/MEDQ  D:  05/02/2003  T:  05/02/2003  Job:  [id number]

## 2015-06-23 LAB — OB RESULTS CONSOLE RUBELLA ANTIBODY, IGM: Rubella: IMMUNE

## 2015-06-23 LAB — OB RESULTS CONSOLE VARICELLA ZOSTER ANTIBODY, IGG: Varicella: IMMUNE

## 2015-06-23 LAB — OB RESULTS CONSOLE GBS: GBS: POSITIVE

## 2015-06-23 LAB — OB RESULTS CONSOLE PLATELET COUNT: Platelets: 271 10*3/uL

## 2015-06-23 LAB — OB RESULTS CONSOLE GC/CHLAMYDIA
Chlamydia: NEGATIVE
Gonorrhea: NEGATIVE

## 2015-06-23 LAB — OB RESULTS CONSOLE HGB/HCT, BLOOD
HCT: 36 %
Hemoglobin: 11.8 g/dL

## 2015-06-23 LAB — OB RESULTS CONSOLE ANTIBODY SCREEN: Antibody Screen: NEGATIVE

## 2015-06-23 LAB — OB RESULTS CONSOLE ABO/RH: RH Type: POSITIVE

## 2015-06-23 LAB — OB RESULTS CONSOLE RPR: RPR: NONREACTIVE

## 2015-06-23 LAB — OB RESULTS CONSOLE HEPATITIS B SURFACE ANTIGEN: Hepatitis B Surface Ag: NEGATIVE

## 2015-06-23 LAB — OB RESULTS CONSOLE HIV ANTIBODY (ROUTINE TESTING): HIV: NONREACTIVE

## 2015-08-01 LAB — OB RESULTS CONSOLE HGB/HCT, BLOOD
HCT: 32 %
Hemoglobin: 10.9 g/dL

## 2015-08-01 LAB — OB RESULTS CONSOLE RPR: RPR: NONREACTIVE

## 2015-08-05 ENCOUNTER — Encounter: Payer: Self-pay | Admitting: *Deleted

## 2015-08-05 DIAGNOSIS — O34219 Maternal care for unspecified type scar from previous cesarean delivery: Secondary | ICD-10-CM | POA: Insufficient documentation

## 2015-08-05 DIAGNOSIS — Z98891 History of uterine scar from previous surgery: Secondary | ICD-10-CM | POA: Insufficient documentation

## 2015-08-05 DIAGNOSIS — R8271 Bacteriuria: Secondary | ICD-10-CM | POA: Insufficient documentation

## 2015-08-05 DIAGNOSIS — O24419 Gestational diabetes mellitus in pregnancy, unspecified control: Secondary | ICD-10-CM

## 2015-08-05 DIAGNOSIS — O0990 Supervision of high risk pregnancy, unspecified, unspecified trimester: Secondary | ICD-10-CM | POA: Insufficient documentation

## 2015-08-05 LAB — CYTOLOGY - PAP: Pap: NEGATIVE

## 2015-08-10 ENCOUNTER — Encounter: Payer: Self-pay | Admitting: Obstetrics and Gynecology

## 2015-08-10 ENCOUNTER — Ambulatory Visit (INDEPENDENT_AMBULATORY_CARE_PROVIDER_SITE_OTHER): Payer: Self-pay | Admitting: Obstetrics and Gynecology

## 2015-08-10 VITALS — BP 113/64 | HR 87 | Wt 187.7 lb

## 2015-08-10 DIAGNOSIS — Z309 Encounter for contraceptive management, unspecified: Secondary | ICD-10-CM

## 2015-08-10 DIAGNOSIS — O24419 Gestational diabetes mellitus in pregnancy, unspecified control: Secondary | ICD-10-CM

## 2015-08-10 DIAGNOSIS — E669 Obesity, unspecified: Secondary | ICD-10-CM

## 2015-08-10 DIAGNOSIS — Z98891 History of uterine scar from previous surgery: Secondary | ICD-10-CM

## 2015-08-10 DIAGNOSIS — O0993 Supervision of high risk pregnancy, unspecified, third trimester: Secondary | ICD-10-CM

## 2015-08-10 DIAGNOSIS — O4402 Placenta previa specified as without hemorrhage, second trimester: Secondary | ICD-10-CM

## 2015-08-10 DIAGNOSIS — R8271 Bacteriuria: Secondary | ICD-10-CM

## 2015-08-10 DIAGNOSIS — O34219 Maternal care for unspecified type scar from previous cesarean delivery: Secondary | ICD-10-CM

## 2015-08-10 DIAGNOSIS — O4412 Placenta previa with hemorrhage, second trimester: Secondary | ICD-10-CM

## 2015-08-10 DIAGNOSIS — O99213 Obesity complicating pregnancy, third trimester: Secondary | ICD-10-CM

## 2015-08-10 DIAGNOSIS — Z6837 Body mass index (BMI) 37.0-37.9, adult: Secondary | ICD-10-CM

## 2015-08-10 NOTE — Progress Notes (Signed)
OB Transfer Visit  Subjective:    Robin Cortez is being seen today for her first obstetrical visit with [facility]. She'd [facility] been [facility] 30 y/o [id number] @ 30/1 (LMP=23wk u/s, EDC 8/8). Preg c/b new GDM dx, h/o c-section x 3, GBS bacteruria, h/o depression, BMI 37. Pregnancy history fully reviewed. She denies any h/o HTN or GDM outside of pregnancy. She states all her children are healthy and doing well with no problems or issues.   Patient reports no s/s of decreased FM or PTL. .  OBGYN: no h/o STIs or abnormal pap smear Social: negative x 3. Not working. Single. Good relationship with FOB. No DV issues Meds: PNV NKDA  Review of Systems:   Review of Systems As per HPI  Objective:     BP 113/64 mmHg  Pulse 87  Wt 187 lb 11.2 oz (85.14 kg)  LMP 01/11/2015 (Exact Date) Physical Exam  Exam NAD FH 34, gravid, nttp Assessment:    Pregnancy: [id number] Patient Active Problem List   Diagnosis Date Noted  . BMI 37.0-37.9, adult 08/10/2015  . Obesity affecting pregnancy in third trimester, antepartum 08/10/2015  . Contraception management 08/10/2015  . Supervision of high risk pregnancy, antepartum 08/05/2015  . Gestational diabetes mellitus (GDM), antepartum 08/05/2015  . Group B streptococcal bacteriuria 08/05/2015  . History of cesarean section, low transverse 08/05/2015  . Previous cesarean section complicating pregnancy, antepartum condition or complication 08/05/2015      Plan:  *IUP: routine care. Offer tdap nv. Desires BTL. Can sign papers at nv.  *New GDM dx: has DM teaching and u/s appt tomorrow. Supplies given today since pt doesn't have insurance. RTC 1wk for BS log check. Importance of good log stressed with patient *h/o c-section x 3: can schedule rpt and BTL date nv. Op note from 09/2009 (last c-section) notes thin LUS but no mention of adhesions.  *GBS bacteruria: unable to void today. Needs TOC nv *h/o depression: no  issues  Follow up in 1 weeks. 50% of 25 min visit spent on counseling and coordination of care.  Entire visit done with spanish interpreter present.    [employer] Bingharlie Maison Agrusa MD 08/10/2015

## 2015-08-11 ENCOUNTER — Other Ambulatory Visit: Payer: Self-pay

## 2015-08-11 ENCOUNTER — Encounter: Payer: Medicaid Other | Attending: Obstetrics & Gynecology | Admitting: *Deleted

## 2015-08-11 ENCOUNTER — Ambulatory Visit (HOSPITAL_COMMUNITY)
Admission: RE | Admit: 2015-08-11 | Discharge: 2015-08-11 | Disposition: A | Discharge status: Home / Self Care | Payer: Medicaid Other | Source: Ambulatory Visit | Attending: Physician Assistant | Admitting: Physician Assistant

## 2015-08-11 DIAGNOSIS — O4400 Placenta previa specified as without hemorrhage, unspecified trimester: Secondary | ICD-10-CM | POA: Insufficient documentation

## 2015-08-11 DIAGNOSIS — O24410 Gestational diabetes mellitus in pregnancy, diet controlled: Secondary | ICD-10-CM

## 2015-08-11 DIAGNOSIS — Z029 Encounter for administrative examinations, unspecified: Secondary | ICD-10-CM | POA: Insufficient documentation

## 2015-08-11 NOTE — Progress Notes (Signed)
  Patient was seen on 08/11/2015 for Gestational Diabetes self-management. The following learning objectives were met by the patient: Patient states history of GDM with 2nd pregnancy, and her mother has diabetes, taking insulin   States the definition of Gestational Diabetes  States why dietary management is important in controlling blood glucose  Describes the effects each nutrient has on blood glucose levels  Demonstrates ability to create a balanced meal plan  Demonstrates carbohydrate counting   States when to check blood glucose levels  Demonstrates proper blood glucose monitoring techniques  States the effect of stress and exercise on blood glucose levels  States the importance of limiting caffeine and abstaining from alcohol and smoking  Blood glucose monitor instructions reviewed, patient states she already has a meter. I provided her a Log Book today  Patient instructed to monitor glucose levels: FBS: 60 - <90 1 hour: <140 2 hour: <120  *Patient received handouts:  Nutrition Diabetes and Pregnancy  Carbohydrate Counting List  Patient will be seen for follow-up as needed.

## 2015-08-11 NOTE — Progress Notes (Signed)
Addendum: patient has posterior placenta previa noted on 23wk u/s. Pt already scheduled for 6/1 follow up growth. Follow up results

## 2015-08-15 ENCOUNTER — Ambulatory Visit: Payer: Self-pay

## 2015-08-18 ENCOUNTER — Encounter: Payer: Self-pay | Admitting: Family Medicine

## 2015-08-22 ENCOUNTER — Encounter: Payer: Self-pay | Admitting: Obstetrics & Gynecology

## 2015-09-29 ENCOUNTER — Encounter: Payer: Medicaid Other | Admitting: Obstetrics & Gynecology

## 2015-09-29 ENCOUNTER — Ambulatory Visit (INDEPENDENT_AMBULATORY_CARE_PROVIDER_SITE_OTHER): Payer: Self-pay | Admitting: Family

## 2015-09-29 VITALS — BP 97/58 | HR 80 | Wt 198.9 lb

## 2015-09-29 DIAGNOSIS — O4413 Placenta previa with hemorrhage, third trimester: Secondary | ICD-10-CM

## 2015-09-29 DIAGNOSIS — O34219 Maternal care for unspecified type scar from previous cesarean delivery: Secondary | ICD-10-CM

## 2015-09-29 DIAGNOSIS — O4403 Placenta previa specified as without hemorrhage, third trimester: Secondary | ICD-10-CM

## 2015-09-29 DIAGNOSIS — Z3009 Encounter for other general counseling and advice on contraception: Secondary | ICD-10-CM

## 2015-09-29 DIAGNOSIS — O2343 Unspecified infection of urinary tract in pregnancy, third trimester: Secondary | ICD-10-CM

## 2015-09-29 DIAGNOSIS — O24410 Gestational diabetes mellitus in pregnancy, diet controlled: Secondary | ICD-10-CM

## 2015-09-29 DIAGNOSIS — Z98891 History of uterine scar from previous surgery: Secondary | ICD-10-CM

## 2015-09-29 DIAGNOSIS — R8271 Bacteriuria: Secondary | ICD-10-CM

## 2015-09-29 DIAGNOSIS — Z113 Encounter for screening for infections with a predominantly sexual mode of transmission: Secondary | ICD-10-CM

## 2015-09-29 DIAGNOSIS — E1100 Type 2 diabetes mellitus with hyperosmolarity without nonketotic hyperglycemic-hyperosmolar coma (NKHHC): Secondary | ICD-10-CM

## 2015-09-29 DIAGNOSIS — O0993 Supervision of high risk pregnancy, unspecified, third trimester: Secondary | ICD-10-CM

## 2015-09-29 LAB — GLUCOSE, CAPILLARY: GLUCOSE-CAPILLARY: 120 mg/dL — AB (ref 65–99)

## 2015-09-29 NOTE — Progress Notes (Signed)
Spanish interpreter Casel [id number] used Need u/a

## 2015-09-29 NOTE — Progress Notes (Signed)
[id number] Robin Cortez Cortez video interpreter  Subjective:  Robin Cortez Cortez is a 30 y.o. [phone] at 691w3d being seen today for ongoing prenatal care.  She is currently monitored for the following issues for this high-risk pregnancy and has Supervision of high risk pregnancy, antepartum; Gestational diabetes mellitus (GDM), antepartum; Group B streptococcal bacteriuria; History of cesarean section, low transverse; Previous cesarean section complicating pregnancy, antepartum condition or complication; BMI 37.0-37.9, adult; Obesity affecting pregnancy in third trimester, antepartum; Contraception management; and Placenta previa antepartum on her problem list.  Patient reports no complaints. [place] in [place].  States not checking blood glucose, but eating well.   Contractions: Irritability. Vag. Bleeding: None.  Movement: Present. Denies leaking of fluid.   The following portions of the patient's history were reviewed and updated as appropriate: allergies, current medications, past family history, past medical history, past social history, past surgical history and problem list. Problem list updated.  Objective:   Filed Vitals:   09/29/15 1549  BP: 97/58  Pulse: 80  Weight: 198 lb 14.4 oz (90.22 kg)   Results for orders placed or performed in visit on 09/29/15 (from the past 24 hour(s))  Glucose, capillary     Status: Abnormal   Collection Time: 09/29/15  4:07 PM  Result Value Ref Range   Glucose-Capillary 120 (H) 65 - 99 mg/dL    Fetal Status: Fetal Heart Rate (bpm): 145 Fundal Height: 39 cm Movement: Present     General:  Alert, oriented and cooperative. Patient is in no acute distress.  Skin: Skin is warm and dry. No rash noted.   Cardiovascular: Normal heart rate noted  Respiratory: Normal respiratory effort, no problems with respiration noted  Abdomen: Soft, gravid, appropriate for gestational age. Pain/Pressure: Present     Pelvic:  Cervical exam deferred         Extremities: Normal range of motion.  Edema: None  Mental Status: Normal mood and affect. Normal behavior. Normal judgment and thought content.   Urinalysis:     Urine results not available at discharge.    Assessment and Plan:  Pregnancy: G4P3003 at 451w3d  1. Supervision of high risk pregnancy in third trimester - Culture, beta strep (group b only) - GC/Chlamydia probe amp ([place]  2. Type 2 diabetes mellitus with hyperosmolarity without coma, without long-term current use of insulin (HCC) - US MFM OB FOLLOW UP; Future w/BPP   3. Supervision of high risk pregnancy, antepartum, third trimester - Obtained GBS and GC/CT (GBS was not needed, +urine culture)  4. Encounter for other general counseling or advice on contraception - Plans for BTL, Self-Pay  5. UTI in pregnancy, antepartum, third trimester - Culture, OB Urine (TOC)  6. Group B streptococcal bacteriuria   7. Diet controlled gestational diabetes mellitus (GDM), antepartum - Explained importance of knowing blood glucose levels - Normal value today; fundal height appropriate - Obtain growth ultrasounds and BPP  8. Placenta previa antepartum, third trimester - Resolved on ultrasound in May (see media tab)  9. History of cesarean section, low transverse - Schedule repeat ultrasound  Term labor symptoms and general obstetric precautions including but not limited to vaginal bleeding, contractions, leaking of fluid and fetal movement were reviewed in detail with the patient. Please refer to After Visit Summary for other counseling recommendations.  Return in about 1 week (around 10/06/2015).   Eino FarberWalidah Kennith GainN Karim, CNM

## 2015-09-30 ENCOUNTER — Encounter: Payer: Self-pay | Admitting: Family

## 2015-09-30 ENCOUNTER — Encounter (HOSPITAL_COMMUNITY): Payer: Self-pay

## 2015-09-30 LAB — CULTURE, OB URINE: Organism ID, Bacteria: 10000

## 2015-09-30 NOTE — Pre-Procedure Instructions (Signed)
[id number] interpreter number

## 2015-10-01 LAB — CULTURE, BETA STREP (GROUP B ONLY)

## 2015-10-03 LAB — GC/CHLAMYDIA PROBE AMP (~~LOC~~) NOT AT ARMC
Chlamydia: NEGATIVE
Neisseria Gonorrhea: NEGATIVE

## 2015-10-07 ENCOUNTER — Encounter (HOSPITAL_COMMUNITY): Payer: Self-pay

## 2015-10-07 ENCOUNTER — Ambulatory Visit (HOSPITAL_COMMUNITY)
Admission: RE | Admit: 2015-10-07 | Discharge: 2015-10-07 | Disposition: A | Discharge status: Home / Self Care | Payer: Self-pay | Source: Ambulatory Visit | Attending: Family | Admitting: Family

## 2015-10-07 ENCOUNTER — Other Ambulatory Visit: Payer: Self-pay | Admitting: Family

## 2015-10-07 DIAGNOSIS — Z3A38 38 weeks gestation of pregnancy: Secondary | ICD-10-CM | POA: Insufficient documentation

## 2015-10-07 DIAGNOSIS — E1100 Type 2 diabetes mellitus with hyperosmolarity without nonketotic hyperglycemic-hyperosmolar coma (NKHHC): Secondary | ICD-10-CM

## 2015-10-07 DIAGNOSIS — O0993 Supervision of high risk pregnancy, unspecified, third trimester: Secondary | ICD-10-CM

## 2015-10-07 DIAGNOSIS — O09293 Supervision of pregnancy with other poor reproductive or obstetric history, third trimester: Secondary | ICD-10-CM

## 2015-10-07 DIAGNOSIS — O34219 Maternal care for unspecified type scar from previous cesarean delivery: Secondary | ICD-10-CM | POA: Insufficient documentation

## 2015-10-07 DIAGNOSIS — O24410 Gestational diabetes mellitus in pregnancy, diet controlled: Secondary | ICD-10-CM

## 2015-10-07 DIAGNOSIS — Z3689 Encounter for other specified antenatal screening: Secondary | ICD-10-CM

## 2015-10-07 DIAGNOSIS — Z36 Encounter for antenatal screening of mother: Secondary | ICD-10-CM | POA: Insufficient documentation

## 2015-10-07 DIAGNOSIS — R8271 Bacteriuria: Secondary | ICD-10-CM

## 2015-10-07 NOTE — Patient Instructions (Signed)
Instrucciones Para Antes de la Ciruga   Su ciruga est programada para 10/11/2015  (your procedure is scheduled on) Entre por la entrada principal del [facility]  a las [address] de la [facility] -(enter through the main entrance at [facility] at { AM    [address] telfono, [place] el [phone] para informarnos de su llegada. (pick up phone, dial [phone] on arrival)     Por favor llame al [phone] si tiene algn problema en la maana de la ciruga (please call  if you have any problems the morning of surgery.)                  Recuerde: (Remember)  No coma alimentos. No comer despues media a noche. (Do not eat food )    No tome lquidos claros.No beber despues media a noche. (Do not drink clear liquids)    No use joyas, maquillaje de ojos, lpiz labial, crema para el cuerpo o esmalte de uas oscuro. (Do not wear jewelry, eye makeup, lipstick, body lotion, or dark fingernail polish). Puede usar desodorante (you may wear deodorant)    No se afeite 48 horas de su ciruga. (Do not shave 48 hours before your surgery)    No traiga objetos de valor al hospital.  [facility] no se hace responsable de ninguna pertenencia, ni objetos de valor que haya trado al hospital. (Do not bring valuable to the hospital.  [facility] is not responsible for any belongings or valuables brought to the hospital)   [facility] medicinas en la maana de la ciruga con un SORBITO de agua nada(take these meds the morning of surgery with a SIP of water)     Durante la ciruga no se pueden usar lentes de contacto, dentaduras o puentes. (Contacts, dentures or bridgework cannot be worn in surgery).   Si va a ser ingresado despus de la ciruga, deje la [employer] en el carro hasta que se le haya asignado una habitacin. (If you are to be admitted after surgery, leave suitcase in car until your room has been assigned.)   A los pacientes que se les d de alta el mismo da no se les  permitir manejar a casa.  (Patients discharged on the day of surgery will not be allowed to drive home)    [place] y nmero de telfono del Programmer, multimedia na (Name and telephone number of your driver)   Instrucciones especiales N/A (Special Instructions)   Por favor, lea las hojas informativas que le entregaron. (Please read over the following fact sheets that you were given) Surgical Site Infection Prevention

## 2015-10-10 ENCOUNTER — Encounter (HOSPITAL_COMMUNITY)
Admission: RE | Admit: 2015-10-10 | Discharge: 2015-10-10 | Disposition: A | Discharge status: Home / Self Care | Payer: Self-pay | Source: Ambulatory Visit | Attending: Obstetrics & Gynecology | Admitting: Obstetrics & Gynecology

## 2015-10-10 ENCOUNTER — Encounter: Payer: Self-pay | Admitting: Family

## 2015-10-10 ENCOUNTER — Other Ambulatory Visit: Payer: Self-pay | Admitting: Obstetrics & Gynecology

## 2015-10-10 DIAGNOSIS — O34219 Maternal care for unspecified type scar from previous cesarean delivery: Secondary | ICD-10-CM | POA: Insufficient documentation

## 2015-10-10 DIAGNOSIS — O0930 Supervision of pregnancy with insufficient antenatal care, unspecified trimester: Secondary | ICD-10-CM | POA: Insufficient documentation

## 2015-10-10 DIAGNOSIS — O3660X0 Maternal care for excessive fetal growth, unspecified trimester, not applicable or unspecified: Secondary | ICD-10-CM | POA: Insufficient documentation

## 2015-10-10 DIAGNOSIS — O24419 Gestational diabetes mellitus in pregnancy, unspecified control: Secondary | ICD-10-CM | POA: Insufficient documentation

## 2015-10-10 DIAGNOSIS — Z98891 History of uterine scar from previous surgery: Secondary | ICD-10-CM | POA: Insufficient documentation

## 2015-10-10 DIAGNOSIS — Z01812 Encounter for preprocedural laboratory examination: Secondary | ICD-10-CM | POA: Insufficient documentation

## 2015-10-10 LAB — BASIC METABOLIC PANEL
ANION GAP: 7 (ref 5–15)
BUN: 8 mg/dL (ref 6–20)
CALCIUM: 8.6 mg/dL — AB (ref 8.9–10.3)
CO2: 21 mmol/L — AB (ref 22–32)
Chloride: 109 mmol/L (ref 101–111)
Creatinine, Ser: 0.52 mg/dL (ref 0.44–1.00)
GFR calc non Af Amer: >60 mL/min (ref >60)
Glucose, Bld: 88 mg/dL (ref 65–99)
Potassium: 3.6 mmol/L (ref 3.5–5.1)
Sodium: 137 mmol/L (ref 135–145)

## 2015-10-10 LAB — CBC
HCT: 32.3 % — ABNORMAL LOW (ref 36.0–46.0)
Hemoglobin: 11.1 g/dL — ABNORMAL LOW (ref 12.0–15.0)
MCH: 29.9 pg (ref 26.0–34.0)
MCHC: 34.4 g/dL (ref 30.0–36.0)
MCV: 87.1 fL (ref 78.0–100.0)
PLATELETS: 257 10*3/uL (ref 150–400)
RBC: 3.71 MIL/uL — ABNORMAL LOW (ref 3.87–5.11)
RDW: 14.1 % (ref 11.5–15.5)
WBC: 8.9 10*3/uL (ref 4.0–10.5)

## 2015-10-11 ENCOUNTER — Inpatient Hospital Stay (HOSPITAL_COMMUNITY)
Admission: RE | Admit: 2015-10-11 | Discharge: 2015-10-13 | DRG: 766 | Disposition: A | Discharge status: Home / Self Care | Payer: Medicaid Other | Source: Ambulatory Visit | Attending: Obstetrics & Gynecology | Admitting: Obstetrics & Gynecology

## 2015-10-11 ENCOUNTER — Encounter (HOSPITAL_COMMUNITY)
Admission: RE | Disposition: A | Discharge status: Home / Self Care | Payer: Self-pay | Source: Ambulatory Visit | Attending: Obstetrics & Gynecology

## 2015-10-11 ENCOUNTER — Inpatient Hospital Stay (HOSPITAL_COMMUNITY): Payer: Medicaid Other | Admitting: Anesthesiology

## 2015-10-11 ENCOUNTER — Encounter (HOSPITAL_COMMUNITY): Payer: Self-pay

## 2015-10-11 DIAGNOSIS — Z8249 Family history of ischemic heart disease and other diseases of the circulatory system: Secondary | ICD-10-CM

## 2015-10-11 DIAGNOSIS — Z3A39 39 weeks gestation of pregnancy: Secondary | ICD-10-CM

## 2015-10-11 DIAGNOSIS — Z98891 History of uterine scar from previous surgery: Secondary | ICD-10-CM

## 2015-10-11 DIAGNOSIS — O24420 Gestational diabetes mellitus in childbirth, diet controlled: Secondary | ICD-10-CM | POA: Diagnosis present

## 2015-10-11 DIAGNOSIS — O34211 Maternal care for low transverse scar from previous cesarean delivery: Principal | ICD-10-CM | POA: Diagnosis present

## 2015-10-11 DIAGNOSIS — Z302 Encounter for sterilization: Secondary | ICD-10-CM | POA: Diagnosis not present

## 2015-10-11 DIAGNOSIS — O99824 Streptococcus B carrier state complicating childbirth: Secondary | ICD-10-CM | POA: Diagnosis present

## 2015-10-11 DIAGNOSIS — Z833 Family history of diabetes mellitus: Secondary | ICD-10-CM

## 2015-10-11 DIAGNOSIS — O3660X0 Maternal care for excessive fetal growth, unspecified trimester, not applicable or unspecified: Secondary | ICD-10-CM | POA: Diagnosis present

## 2015-10-11 DIAGNOSIS — O3663X0 Maternal care for excessive fetal growth, third trimester, not applicable or unspecified: Secondary | ICD-10-CM | POA: Diagnosis present

## 2015-10-11 DIAGNOSIS — O0990 Supervision of high risk pregnancy, unspecified, unspecified trimester: Secondary | ICD-10-CM

## 2015-10-11 DIAGNOSIS — O0993 Supervision of high risk pregnancy, unspecified, third trimester: Secondary | ICD-10-CM

## 2015-10-11 DIAGNOSIS — O24419 Gestational diabetes mellitus in pregnancy, unspecified control: Secondary | ICD-10-CM | POA: Diagnosis present

## 2015-10-11 DIAGNOSIS — O34219 Maternal care for unspecified type scar from previous cesarean delivery: Secondary | ICD-10-CM

## 2015-10-11 HISTORY — PX: TUBAL LIGATION: SHX77

## 2015-10-11 LAB — GLUCOSE, CAPILLARY
GLUCOSE-CAPILLARY: 97 mg/dL (ref 65–99)
Glucose-Capillary: 91 mg/dL (ref 65–99)

## 2015-10-11 LAB — PREPARE RBC (CROSSMATCH)

## 2015-10-11 LAB — RPR: RPR Ser Ql: NONREACTIVE

## 2015-10-11 SURGERY — Surgical Case
Anesthesia: Spinal | Site: Abdomen

## 2015-10-11 MED ORDER — PHENYLEPHRINE 8 MG IN D5W 100 ML (0.08MG/ML) PREMIX OPTIME
INJECTION | INTRAVENOUS | Status: DC | PRN
  Administered 2015-10-11: 60 ug/min via INTRAVENOUS

## 2015-10-11 MED ORDER — KETOROLAC TROMETHAMINE 30 MG/ML IJ SOLN: 30.0000 mg | Freq: Four times a day (QID) | INTRAMUSCULAR | Status: AC | PRN

## 2015-10-11 MED ORDER — DIPHENHYDRAMINE HCL 50 MG/ML IJ SOLN: 12.5000 mg | INTRAMUSCULAR | Status: DC | PRN

## 2015-10-11 MED ORDER — LACTATED RINGERS IV SOLN
INTRAVENOUS | Status: DC
  Administered 2015-10-11: 20:00:00 via INTRAVENOUS

## 2015-10-11 MED ORDER — FENTANYL CITRATE (PF) 100 MCG/2ML IJ SOLN
INTRAMUSCULAR | Status: AC
  Filled 2015-10-11: qty 2

## 2015-10-11 MED ORDER — MEPERIDINE HCL 25 MG/ML IJ SOLN
6.2500 mg | INTRAMUSCULAR | Status: DC | PRN
Start: 2015-10-11 — End: 2015-10-11

## 2015-10-11 MED ORDER — DIBUCAINE 1 % RE OINT
1.0000 | TOPICAL_OINTMENT | RECTAL | Status: DC | PRN
Start: 2015-10-11 — End: 2015-10-13

## 2015-10-11 MED ORDER — DIPHENHYDRAMINE HCL 25 MG PO CAPS: 25.0000 mg | ORAL_CAPSULE | ORAL | Status: DC | PRN

## 2015-10-11 MED ORDER — NALBUPHINE HCL 10 MG/ML IJ SOLN: 5.0000 mg | Freq: Once | INTRAMUSCULAR | Status: DC | PRN

## 2015-10-11 MED ORDER — ONDANSETRON HCL 4 MG/2ML IJ SOLN
INTRAMUSCULAR | Status: AC
  Filled 2015-10-11: qty 2

## 2015-10-11 MED ORDER — ONDANSETRON HCL 4 MG/2ML IJ SOLN
INTRAMUSCULAR | Status: DC | PRN
  Administered 2015-10-11: 4 mg via INTRAVENOUS

## 2015-10-11 MED ORDER — PHENYLEPHRINE 8 MG IN D5W 100 ML (0.08MG/ML) PREMIX OPTIME
INJECTION | INTRAVENOUS | Status: AC
  Filled 2015-10-11: qty 100

## 2015-10-11 MED ORDER — SIMETHICONE 80 MG PO CHEW: 80.0000 mg | CHEWABLE_TABLET | ORAL | Status: DC | PRN

## 2015-10-11 MED ORDER — MORPHINE SULFATE (PF) 0.5 MG/ML IJ SOLN
INTRAMUSCULAR | Status: DC | PRN
  Administered 2015-10-11: .2 mg via INTRATHECAL
  Administered 2015-10-11: .3 mg via INTRAVENOUS

## 2015-10-11 MED ORDER — KETOROLAC TROMETHAMINE 30 MG/ML IJ SOLN: 30.0000 mg | Freq: Once | INTRAMUSCULAR | Status: DC

## 2015-10-11 MED ORDER — NALBUPHINE HCL 10 MG/ML IJ SOLN: 5.0000 mg | INTRAMUSCULAR | Status: DC | PRN

## 2015-10-11 MED ORDER — CEFAZOLIN SODIUM-DEXTROSE 2-4 GM/100ML-% IV SOLN
2.0000 g | INTRAVENOUS | Status: AC
  Administered 2015-10-11: 2 g via INTRAVENOUS

## 2015-10-11 MED ORDER — OXYTOCIN 40 UNITS IN LACTATED RINGERS INFUSION - SIMPLE MED: 2.5000 [IU]/h | INTRAVENOUS | Status: AC

## 2015-10-11 MED ORDER — ACETAMINOPHEN 325 MG PO TABS: 650.0000 mg | ORAL_TABLET | ORAL | Status: DC | PRN

## 2015-10-11 MED ORDER — BUPIVACAINE IN DEXTROSE 0.75-8.25 % IT SOLN
INTRATHECAL | Status: DC | PRN
  Administered 2015-10-11: 10.5 mg via INTRATHECAL

## 2015-10-11 MED ORDER — IBUPROFEN 600 MG PO TABS: 600.0000 mg | ORAL_TABLET | Freq: Four times a day (QID) | ORAL | Status: DC | PRN

## 2015-10-11 MED ORDER — FENTANYL CITRATE (PF) 100 MCG/2ML IJ SOLN
INTRAMUSCULAR | Status: DC | PRN
  Administered 2015-10-11: 10 ug via INTRATHECAL

## 2015-10-11 MED ORDER — SCOPOLAMINE 1 MG/3DAYS TD PT72
1.0000 | MEDICATED_PATCH | Freq: Once | TRANSDERMAL | Status: DC
  Administered 2015-10-11: 1.5 mg via TRANSDERMAL

## 2015-10-11 MED ORDER — DIPHENHYDRAMINE HCL 25 MG PO CAPS
25.0000 mg | ORAL_CAPSULE | ORAL | Status: DC | PRN
  Filled 2015-10-11: qty 1

## 2015-10-11 MED ORDER — SODIUM CHLORIDE 0.9% FLUSH: 3.0000 mL | INTRAVENOUS | Status: DC | PRN

## 2015-10-11 MED ORDER — OXYCODONE-ACETAMINOPHEN 5-325 MG PO TABS
1.0000 | ORAL_TABLET | ORAL | Status: DC | PRN
  Administered 2015-10-12 – 2015-10-13 (×3): 1 via ORAL
  Filled 2015-10-11 (×3): qty 1

## 2015-10-11 MED ORDER — WITCH HAZEL-GLYCERIN EX PADS: 1.0000 "application " | MEDICATED_PAD | CUTANEOUS | Status: DC | PRN

## 2015-10-11 MED ORDER — SENNOSIDES-DOCUSATE SODIUM 8.6-50 MG PO TABS
2.0000 | ORAL_TABLET | ORAL | Status: DC
  Administered 2015-10-12 – 2015-10-13 (×2): 2 via ORAL
  Filled 2015-10-11 (×2): qty 2

## 2015-10-11 MED ORDER — SCOPOLAMINE 1 MG/3DAYS TD PT72
1.0000 | MEDICATED_PATCH | Freq: Once | TRANSDERMAL | Status: DC
Start: 2015-10-11 — End: 2015-10-13

## 2015-10-11 MED ORDER — KETOROLAC TROMETHAMINE 30 MG/ML IJ SOLN
INTRAMUSCULAR | Status: AC
  Filled 2015-10-11: qty 1

## 2015-10-11 MED ORDER — NALOXONE HCL 0.4 MG/ML IJ SOLN: 0.4000 mg | INTRAMUSCULAR | Status: DC | PRN

## 2015-10-11 MED ORDER — LACTATED RINGERS IV SOLN: INTRAVENOUS | Status: DC

## 2015-10-11 MED ORDER — BUPIVACAINE HCL (PF) 0.5 % IJ SOLN
INTRAMUSCULAR | Status: DC | PRN
  Administered 2015-10-11: 27 mL
  Administered 2015-10-11: 3 mL

## 2015-10-11 MED ORDER — SCOPOLAMINE 1 MG/3DAYS TD PT72: 1.0000 | MEDICATED_PATCH | Freq: Once | TRANSDERMAL | Status: DC

## 2015-10-11 MED ORDER — ZOLPIDEM TARTRATE 5 MG PO TABS: 5.0000 mg | ORAL_TABLET | Freq: Every evening | ORAL | Status: DC | PRN

## 2015-10-11 MED ORDER — MORPHINE SULFATE-NACL 0.5-0.9 MG/ML-% IV SOSY
PREFILLED_SYRINGE | INTRAVENOUS | Status: AC
  Filled 2015-10-11: qty 1

## 2015-10-11 MED ORDER — SODIUM CHLORIDE 0.9 % IR SOLN
Status: DC | PRN
  Administered 2015-10-11: 200 mL

## 2015-10-11 MED ORDER — BUPIVACAINE HCL (PF) 0.5 % IJ SOLN
INTRAMUSCULAR | Status: AC
  Filled 2015-10-11: qty 30

## 2015-10-11 MED ORDER — ONDANSETRON HCL 4 MG/2ML IJ SOLN
4.0000 mg | Freq: Three times a day (TID) | INTRAMUSCULAR | Status: DC | PRN
  Administered 2015-10-11: 4 mg via INTRAVENOUS
  Filled 2015-10-11: qty 2

## 2015-10-11 MED ORDER — SIMETHICONE 80 MG PO CHEW
80.0000 mg | CHEWABLE_TABLET | Freq: Three times a day (TID) | ORAL | Status: DC
  Administered 2015-10-12 – 2015-10-13 (×3): 80 mg via ORAL
  Filled 2015-10-11 (×3): qty 1

## 2015-10-11 MED ORDER — SCOPOLAMINE 1 MG/3DAYS TD PT72
MEDICATED_PATCH | TRANSDERMAL | Status: AC
  Administered 2015-10-11: 1.5 mg via TRANSDERMAL
  Filled 2015-10-11: qty 1

## 2015-10-11 MED ORDER — ONDANSETRON HCL 4 MG/2ML IJ SOLN: 4.0000 mg | Freq: Three times a day (TID) | INTRAMUSCULAR | Status: DC | PRN

## 2015-10-11 MED ORDER — NALOXONE HCL 2 MG/2ML IJ SOSY
1.0000 ug/kg/h | PREFILLED_SYRINGE | INTRAMUSCULAR | Status: DC | PRN
  Filled 2015-10-11: qty 2

## 2015-10-11 MED ORDER — TETANUS-DIPHTH-ACELL PERTUSSIS 5-2.5-18.5 LF-MCG/0.5 IM SUSP: 0.5000 mL | Freq: Once | INTRAMUSCULAR | Status: DC

## 2015-10-11 MED ORDER — MENTHOL 3 MG MT LOZG: 1.0000 | LOZENGE | OROMUCOSAL | Status: DC | PRN

## 2015-10-11 MED ORDER — COCONUT OIL OIL: 1.0000 "application " | TOPICAL_OIL | Status: DC | PRN

## 2015-10-11 MED ORDER — KETOROLAC TROMETHAMINE 30 MG/ML IJ SOLN: 30.0000 mg | Freq: Four times a day (QID) | INTRAMUSCULAR | Status: DC | PRN

## 2015-10-11 MED ORDER — HYDROMORPHONE HCL 1 MG/ML IJ SOLN: 0.2500 mg | INTRAMUSCULAR | Status: DC | PRN

## 2015-10-11 MED ORDER — SIMETHICONE 80 MG PO CHEW
80.0000 mg | CHEWABLE_TABLET | ORAL | Status: DC
  Administered 2015-10-12 – 2015-10-13 (×2): 80 mg via ORAL
  Filled 2015-10-11 (×2): qty 1

## 2015-10-11 MED ORDER — OXYTOCIN 10 UNIT/ML IJ SOLN
INTRAMUSCULAR | Status: AC
  Filled 2015-10-11: qty 4

## 2015-10-11 MED ORDER — OXYCODONE-ACETAMINOPHEN 5-325 MG PO TABS: 2.0000 | ORAL_TABLET | ORAL | Status: DC | PRN

## 2015-10-11 MED ORDER — MEPERIDINE HCL 25 MG/ML IJ SOLN: 6.2500 mg | INTRAMUSCULAR | Status: DC | PRN

## 2015-10-11 MED ORDER — DIPHENHYDRAMINE HCL 25 MG PO CAPS: 25.0000 mg | ORAL_CAPSULE | Freq: Four times a day (QID) | ORAL | Status: DC | PRN

## 2015-10-11 MED ORDER — OXYTOCIN 10 UNIT/ML IJ SOLN
INTRAVENOUS | Status: DC | PRN
  Administered 2015-10-11: 40 [IU] via INTRAVENOUS

## 2015-10-11 MED ORDER — NALOXONE HCL 2 MG/2ML IJ SOSY: 1.0000 ug/kg/h | PREFILLED_SYRINGE | INTRAVENOUS | Status: DC | PRN

## 2015-10-11 MED ORDER — IBUPROFEN 600 MG PO TABS
600.0000 mg | ORAL_TABLET | Freq: Four times a day (QID) | ORAL | Status: DC
  Administered 2015-10-12 – 2015-10-13 (×6): 600 mg via ORAL
  Filled 2015-10-11 (×6): qty 1

## 2015-10-11 MED ORDER — PRENATAL MULTIVITAMIN CH
1.0000 | ORAL_TABLET | Freq: Every day | ORAL | Status: DC
  Administered 2015-10-12: 1 via ORAL
  Filled 2015-10-11: qty 1

## 2015-10-11 MED ORDER — KETOROLAC TROMETHAMINE 30 MG/ML IJ SOLN
30.0000 mg | Freq: Four times a day (QID) | INTRAMUSCULAR | Status: DC | PRN
  Administered 2015-10-11: 30 mg via INTRAMUSCULAR

## 2015-10-11 MED ORDER — LACTATED RINGERS IV SOLN
INTRAVENOUS | Status: DC
  Administered 2015-10-11 (×3): via INTRAVENOUS

## 2015-10-11 MED ORDER — LACTATED RINGERS IV SOLN
Freq: Once | INTRAVENOUS | Status: AC
  Administered 2015-10-11: 11:00:00 via INTRAVENOUS

## 2015-10-11 SURGICAL SUPPLY — 36 items
APL SKNCLS STERI-STRIP NONHPOA (GAUZE/BANDAGES/DRESSINGS) ×2
BARRIER ADHS 3X4 INTERCEED (GAUZE/BANDAGES/DRESSINGS) IMPLANT
BENZOIN TINCTURE PRP APPL 2/3 (GAUZE/BANDAGES/DRESSINGS) ×4 IMPLANT
BRR ADH 4X3 ABS CNTRL BYND (GAUZE/BANDAGES/DRESSINGS)
CHLORAPREP W/TINT 26ML (MISCELLANEOUS) ×4 IMPLANT
CLAMP CORD UMBIL (MISCELLANEOUS) IMPLANT
CLIP FILSHIE TUBAL LIGA STRL (Clip) ×4 IMPLANT
CLOSURE WOUND 1/2 X4 (GAUZE/BANDAGES/DRESSINGS) ×1
CLOTH BEACON ORANGE TIMEOUT ST (SAFETY) ×4 IMPLANT
DRSG OPSITE POSTOP 4X10 (GAUZE/BANDAGES/DRESSINGS) ×4 IMPLANT
ELECT REM PT RETURN 9FT ADLT (ELECTROSURGICAL) ×4
ELECTRODE REM PT RTRN 9FT ADLT (ELECTROSURGICAL) ×2 IMPLANT
EXTRACTOR VACUUM KIWI (MISCELLANEOUS) IMPLANT
GLOVE BIO SURGEON STRL SZ 6.5 (GLOVE) ×3 IMPLANT
GLOVE BIO SURGEONS STRL SZ 6.5 (GLOVE) ×1
GLOVE BIOGEL PI IND STRL 7.0 (GLOVE) ×4 IMPLANT
GLOVE BIOGEL PI INDICATOR 7.0 (GLOVE) ×4
GOWN STRL REUS W/TWL LRG LVL3 (GOWN DISPOSABLE) ×8 IMPLANT
KIT ABG SYR 3ML LUER SLIP (SYRINGE) IMPLANT
NEEDLE HYPO 22GX1.5 SAFETY (NEEDLE) IMPLANT
NEEDLE HYPO 25X5/8 SAFETYGLIDE (NEEDLE) IMPLANT
NS IRRIG 1000ML POUR BTL (IV SOLUTION) ×4 IMPLANT
PACK C SECTION WH (CUSTOM PROCEDURE TRAY) ×4 IMPLANT
PAD OB MATERNITY 4.3X12.25 (PERSONAL CARE ITEMS) ×4 IMPLANT
PENCIL SMOKE EVAC W/HOLSTER (ELECTROSURGICAL) ×4 IMPLANT
RETRACTOR WND ALEXIS 25 LRG (MISCELLANEOUS) IMPLANT
RTRCTR WOUND ALEXIS 25CM LRG (MISCELLANEOUS)
STRIP CLOSURE SKIN 1/2X4 (GAUZE/BANDAGES/DRESSINGS) ×3 IMPLANT
SUT PLAIN 2 0 XLH (SUTURE) ×4 IMPLANT
SUT VIC AB 0 CT1 36 (SUTURE) ×24 IMPLANT
SUT VIC AB 2-0 CT1 27 (SUTURE) ×3
SUT VIC AB 2-0 CT1 TAPERPNT 27 (SUTURE) ×2 IMPLANT
SUT VIC AB 4-0 PS2 27 (SUTURE) ×4 IMPLANT
SYR CONTROL 10ML LL (SYRINGE) IMPLANT
TOWEL OR 17X24 6PK STRL BLUE (TOWEL DISPOSABLE) ×4 IMPLANT
TRAY FOLEY CATH SILVER 14FR (SET/KITS/TRAYS/PACK) IMPLANT

## 2015-10-11 NOTE — Op Note (Signed)
Cesarean Section Operative Report  Robin Cortez  10/11/2015  Indications: Repeat elective cesarean and undesired fertility  Pre-operative Diagnosis: repeat c-section with bilateral tubal ligation .   Post-operative Diagnosis: Same   Surgeon: Surgeon(s) and Role:    * Robin Phenix, MD - Primary   Attending Attestation: I was present and scrubbed for the entire procedure.   Assistants: Robin Mow, DO  Anesthesia: spinal    Estimated Blood Loss: 700 ml  Total IV Fluids: 2600 ml LR  Urine Output:: 800 ml clear urine  Specimens: None  Findings: Viable female infant in cephalic, LOA presentation; Apgars 8/9; weight 4415 g; clear amniotic fluid; intact placenta with three vessel cord; normal uterus, fallopian tubes and ovaries bilaterally.  Baby condition / location:  Couplet care / Skin to Skin   Complications: no complications  Indications: Robin Cortez is a 30 y.o. [id number] with an IUP [redacted]w[redacted]d presenting for repeat cesarean.  The risks, benefits, complications, treatment options, and exected outcomes were discussed with the patient . The patient dwith the proposed plan, giving informed consent. identified as Robin Cortez and the procedure verified as C-Section Delivery.  Procedure Details:  The patient was taken back to the operative suite where spinal anesthesia was placed.  A time out was held and the above information confirmed.   After induction of anesthesia, the patient was draped and prepped in the usual sterile manner and placed in a dorsal supine position with a leftward tilt. A Pfannenstiel incision was made and carried down through the subcutaneous tissue to the fascia. Fascial incision was made and extended transversely. The fascia was separated from the underlying rectus tissue superiorly and inferiorly. The peritoneum was identified and entered and extended longitudinally. Alexis retractor was placed. A low transverse uterine incision was made  and extended bluntly. Delivered from cephalic presentation was a viable infant with Apgars and weight as above.  After waiting 60 seconds for delayed cord cutting, the umbilical cord was clamped and cut cord blood was obtained for evaluation. Cord ph was not sent. The placenta was removed Intact and appeared normal. The uterine outline, tubes and ovaries appeared normal. The uterine incision was closed with running locked sutures of 0Vicryl with an imbricating layer of the same.   Hemostasis was observed.   The right fallopian tube was identified and followed out to the fimbriated end.  A Filshie clip was placed on the left fallopian tube about 2 cm from the cornual attachment, with care given to incorporate the underlying mesosalpinx.  A similar process was carried out on the left side allowing for bilateral tubal sterilization.  Good hemostasis was noted overall.  Local analgesia was drizzled on both operative sites.  The peritoneum was closed with 2-0 Vicryl. The rectus muscles were examined and hemostasis observed. The fascia was then reapproximated with running sutures of 2-0Vicryl. A total of 27 ml 50% Marcaine was injected subcutaneously at the margins of the incision. The subcuticular closure was performed using 2-0plain gut. The skin was closed with 4-0Vicryl.   Instrument, sponge, and needle counts were correct prior the abdominal closure and were correct at the conclusion of the case.     Disposition: PACU - hemodynamically stable.   Maternal Condition: stable       SignedJustice Cortez 10/11/2015 3:38 PM

## 2015-10-11 NOTE — Anesthesia Procedure Notes (Signed)
Spinal  Patient location during procedure: OR End time: 10/11/2015 12:54 PM Staffing Anesthesiologist: Jairo Ben Performed: anesthesiologist  Preanesthetic Checklist Completed: patient identified, site marked, surgical consent, pre-op evaluation, timeout performed, IV checked, risks and benefits discussed and monitors and equipment checked Spinal Block Patient position: sitting Prep: Betadine, site prepped and draped and DuraPrep Patient monitoring: heart rate, cardiac monitor, continuous pulse ox and blood pressure Approach: midline Location: L3-4 Injection technique: single-shot Needle Needle type: Quincke  Needle gauge: 25 G Needle length: 9 cm Assessment Sensory level: T4 Additional Notes Pt identified in Operating room.  Monitors applied. Working IV access confirmed. Sterile prep, drape lumbar spine.  1% lido local L 3,4.  #25ga Quincke into clear CSF L 3,4.  10.5 mg 0.75% Bupivacaine with dextrose, fentanyl, morphine injected with asp CSF beginning and end of injection.  Patient asymptomatic, VSS, no heme aspirated, tolerated well.  Sandford Craze, MD

## 2015-10-11 NOTE — Progress Notes (Signed)
I assisted RN with admission information, by Orlan Leavens Spanish Interpreter.

## 2015-10-11 NOTE — Transfer of Care (Signed)
Immediate Anesthesia Transfer of Care Note  Patient: Robin Cortez  Procedure(s) Performed: Procedure(s): REPEAT CESAREAN SECTION (N/A) BILATERAL TUBAL LIGATION (Bilateral)  Patient Location: PACU  Anesthesia Type:Spinal  Level of Consciousness: awake, alert  and oriented  Airway & Oxygen Therapy: Patient Spontanous Breathing  Post-op Assessment: Report given to RN and Post -op Vital signs reviewed and stable  Post vital signs: Reviewed and stable  Last Vitals:  Vitals:   10/11/15 1053  BP: 112/74  Pulse: 79  Resp: 16  Temp: 36.4 C    Last Pain: There were no vitals filed for this visit.    Patients Stated Pain Goal: 3 (10/11/15 1053)  Complications: No apparent anesthesia complications

## 2015-10-11 NOTE — Anesthesia Postprocedure Evaluation (Signed)
Anesthesia Post Note  Patient: Robin Cortez  Procedure(s) Performed: Procedure(s) (LRB): REPEAT CESAREAN SECTION (N/A) BILATERAL TUBAL LIGATION (Bilateral)  Patient location during evaluation: PACU Anesthesia Type: Spinal Level of consciousness: awake and alert, oriented and patient cooperative Pain management: pain level controlled Vital Signs Assessment: post-procedure vital signs reviewed and stable Respiratory status: nonlabored ventilation, spontaneous breathing and respiratory function stable Cardiovascular status: blood pressure returned to baseline Postop Assessment: spinal receding and patient able to bend at knees Anesthetic complications: no     Last Vitals:  Vitals:   10/11/15 1700 10/11/15 1800  BP: (!) 92/46 99/60  Pulse: 60 63  Resp:    Temp: 36.5 C 36.6 C    Last Pain:  Vitals:   10/11/15 1800  TempSrc: Oral   Pain Goal: Patients Stated Pain Goal: 0 (10/11/15 1431)               Imaad Reuss,E. Mallorey Odonell

## 2015-10-11 NOTE — H&P (Signed)
Robin Cortez is a 30 y.o. female presenting for repeat cesarean section at 39 weeks and BTL  [redacted]w[redacted]d .Patient desires surgical management with CS and BTL.  The risks of surgery were discussed in detail with the patient including but not limited to: bleeding which may require transfusion or reoperation; infection which may require prolonged hospitalization or re-hospitalization and antibiotic therapy; injury to bowel, bladder, ureters and major vessels or other surrounding organs; need for additional procedures including laparotomy; thromboembolic phenomenon, incisional problems and other postoperative or anesthesia complications.  Patient was told that the likelihood that her condition and symptoms will be treated effectively with this surgical management was very high; the postoperative expectations were also discussed in detail. The patient also understands the alternative treatment options which were discussed in full. All questions were answered.  30 y.o. [id number] with undesired fertility desires permanent sterilization. Risks and benefits of laparoscopic tubal sterilization procedure was discussed with the patient including permanence of method, bleeding, infection, injury to surrounding organs, anesthesia and need for additional procedures. Risk failure of 0.5-1% with increased risk of ectopic gestation if pregnancy occurs was also discussed with patient. Patient verbalized understanding and all questions were answered.     OB History    Gravida Para Term Preterm AB Living   SAB TAB Ectopic Multiple Live Births           3     Past Medical History:  Diagnosis Date  . Anemia   . Depression    postpartum after 2011 delivery  . Diabetes mellitus without complication (HCC)   . History of gestational hypertension    Past Surgical History:  Procedure Laterality Date  . CESAREAN SECTION     x3   Family History: family history includes Diabetes in her mother; Hypertension in  her mother. Social History:  reports that she has never smoked. She has never used smokeless tobacco. She reports that she does not drink alcohol or use drugs.     Maternal Diabetes: Yes:  Diabetes Type:  Diet controlled Genetic Screening: Normal Maternal Ultrasounds/Referrals: Normal Fetal Ultrasounds or other Referrals:  None Maternal Substance Abuse:  No Significant Maternal Medications:  None Significant Maternal Lab Results:  None Other Comments:  None  Review of Systems  Constitutional: Negative.   Respiratory: Negative.   Cardiovascular: Negative.   Gastrointestinal: Negative.   Genitourinary: Negative.    Maternal Medical History:  Reason for admission: Elective repeat CS and BTL  Fetal activity: Perceived fetal activity is normal.    Prenatal Complications - Diabetes: gestational. Diabetes is managed by diet.        Blood pressure 112/74, pulse 79, temperature 97.6 F (36.4 C), resp. rate 16, last menstrual period 01/11/2015, SpO2 98 %. Maternal Exam:  Abdomen: Patient reports no abdominal tenderness. Surgical scars: low transverse.   Introitus: not evaluated.   Cervix: not evaluated.   Physical Exam  Constitutional: She appears well-developed. No distress.  Neck: Normal range of motion.  Cardiovascular: Normal rate.   Respiratory: Effort normal. No respiratory distress.  Skin: Skin is warm and dry.  Psychiatric: She has a normal mood and affect. Her behavior is normal.    Prenatal labs: ABO, Rh: --/--/O POS (07/31 1115) Antibody: NEG (07/31 1115) Rubella: Immune (04/13 0000) RPR: Non Reactive (07/31 1115)  HBsAg: Negative (04/13 0000)  HIV: Non-reactive (04/13 0000)  GBS: Positive (04/13 0000)   Assessment/Plan: RCS and BTL as scheduled.   ARNOLD,JAMES  10/11/2015, 12:10 PM

## 2015-10-11 NOTE — Anesthesia Preprocedure Evaluation (Signed)
Anesthesia Evaluation  Patient identified by MRN, date of birth, ID band Patient awake    Reviewed: Allergy & Precautions, H&P , NPO status , Patient's Chart, lab work & pertinent test results  Airway Mallampati: II  TM Distance: >3 FB Neck ROM: full    Dental no notable dental hx.    Pulmonary neg pulmonary ROS,    Pulmonary exam normal breath sounds clear to auscultation       Cardiovascular negative cardio ROS Normal cardiovascular exam     Neuro/Psych negative neurological ROS  negative psych ROS   GI/Hepatic negative GI ROS, Neg liver ROS,   Endo/Other  negative endocrine ROSdiabetes  Renal/GU negative Renal ROS     Musculoskeletal   Abdominal (+) + obese,   Peds  Hematology negative hematology ROS (+)   Anesthesia Other Findings   Reproductive/Obstetrics (+) Pregnancy                             Anesthesia Physical Anesthesia Plan  ASA: II  Anesthesia Plan: Spinal   Post-op Pain Management:    Induction:   Airway Management Planned:   Additional Equipment:   Intra-op Plan:   Post-operative Plan:   Informed Consent: I have reviewed the patients History and Physical, chart, labs and discussed the procedure including the risks, benefits and alternatives for the proposed anesthesia with the patient or authorized representative who has indicated his/her understanding and acceptance.     Plan Discussed with: CRNA and Surgeon  Anesthesia Plan Comments:         Anesthesia Quick Evaluation

## 2015-10-12 DIAGNOSIS — Z98891 History of uterine scar from previous surgery: Secondary | ICD-10-CM

## 2015-10-12 LAB — CBC
HCT: 26.3 % — ABNORMAL LOW (ref 36.0–46.0)
HEMOGLOBIN: 8.9 g/dL — AB (ref 12.0–15.0)
MCH: 29.2 pg (ref 26.0–34.0)
MCHC: 33.8 g/dL (ref 30.0–36.0)
MCV: 86.2 fL (ref 78.0–100.0)
Platelets: 231 10*3/uL (ref 150–400)
RBC: 3.05 MIL/uL — AB (ref 3.87–5.11)
RDW: 14.1 % (ref 11.5–15.5)
WBC: 10.3 10*3/uL (ref 4.0–10.5)

## 2015-10-12 NOTE — Progress Notes (Signed)
Patient ID: Robin Cortez, female   DOB: 1986-02-17, 30 y.o.   MRN: [id number] Post Partum Day 1 rLTCS w/ BTL  Subjective: Patient doing very well. No acute events overnight. Endorses no pain. Has received Tylenol only. + flatus. No issue voiding. neg BM. Denies SOB, dizziness, n/v. MOF: br and bt. MOC: BLT. Does not want circ.   Objective: Blood pressure (!) 92/44, pulse 63, temperature 97.8 F (36.6 C), temperature source Oral, resp. rate 18, height 5\' 2"  (1.575 m), weight 89.4 kg (197 lb), last menstrual period 01/11/2015, SpO2 100 %, unknown if currently breastfeeding.  Physical Exam:  General: alert, cooperative and no distress Lochia: appropriate Uterine Fundus: firm Incision: healing well, no significant drainage. No tenderness upon palpation. Some blood showing through honeycomb dressing.  DVT Evaluation: Negative Homan's sign.  Results for orders placed or performed during the hospital encounter of 10/11/15 (from the past 24 hour(s))  Glucose, capillary     Status: None   Collection Time: 10/11/15 11:07 AM  Result Value Ref Range   Glucose-Capillary 97 65 - 99 mg/dL  Prepare RBC (crossmatch)     Status: None   Collection Time: 10/11/15 12:30 PM  Result Value Ref Range   Order Confirmation ORDER PROCESSED BY BLOOD BANK   Glucose, capillary     Status: None   Collection Time: 10/11/15  2:44 PM  Result Value Ref Range   Glucose-Capillary 91 65 - 99 mg/dL  CBC     Status: Abnormal   Collection Time: 10/12/15  5:17 AM  Result Value Ref Range   WBC 10.3 4.0 - 10.5 K/uL   RBC 3.05 (L) 3.87 - 5.11 MIL/uL   Hemoglobin 8.9 (L) 12.0 - 15.0 g/dL   HCT 67.3 (L) 41.9 - 37.9 %   MCV 86.2 78.0 - 100.0 fL   MCH 29.2 26.0 - 34.0 pg   MCHC 33.8 30.0 - 36.0 g/dL   RDW 02.4 09.7 - 35.3 %   Platelets 231 150 - 400 K/uL     Recent Labs  10/10/15 1115 10/12/15 0517  HGB 11.1* 8.9*  HCT 32.3* 26.3*    Assessment/Plan: Robin Cortez is a 30yo [id number] POD#1 rLTCS w/ BTL.  Doing very well. - POD#1 Hbg 8.9. Pt asymptomatic - A1GDM: Postpartum blood glucose WNLs - Consider d/c to home tomorrow   LOS: 1 day   Maryelizabeth Kaufmann, Medical Student 10/12/2015, 7:29 AM    OB FELLOW MEDICAL STUDENT NOTE ATTESTATION  I have seen and examined this patient. Note this is a Psychologist, occupational note and as such does not necessarily reflect the patient's plan of care. Please see progress note for this date of service.    Jen Mow, DO OB Fellow 10/12/2015, 10:14 PM

## 2015-10-12 NOTE — Progress Notes (Signed)
Patient ID: Robin Cortez, female   DOB: 1985-10-17, 30 y.o.   MRN: [id number]  POSTPARTUM PROGRESS NOTE  Post Partum/Op Day #1 Subjective:  Robin Cortez is a 30 y.o. L3Y1017 [redacted]w[redacted]d s/p RLTCS for scheduled repeat.  No acute events overnight.  Pt denies problems with ambulating, voiding or po intake.  She denies nausea or vomiting.  Pain is well controlled.  She has had flatus. She has not had bowel movement.  Lochia Small.   Objective: Blood pressure (!) 92/44, pulse 63, temperature 97.8 F (36.6 C), temperature source Oral, resp. rate 18, height 5\' 2"  (1.575 m), weight 197 lb (89.4 kg), last menstrual period 01/11/2015, SpO2 100 %, unknown if currently breastfeeding.  Physical Exam:  General: alert, cooperative and no distress Lochia:normal flow Chest: CTAB Heart: RRR no m/r/g Abdomen: +BS, soft, nontender,  Uterine Fundus: firm, below umbilicus DVT Evaluation: No calf swelling or tenderness Extremities: trace edema   Recent Labs  10/10/15 1115 10/12/15 0517  HGB 11.1* 8.9*  HCT 32.3* 26.3*    Assessment/Plan:  ASSESSMENT: Robin Cortez is a 30 y.o. P1W2585 [redacted]w[redacted]d s/p RLTCS  Plan for discharge tomorrow, Breastfeeding and Lactation consult. Circumcision outpatient.   LOS: 1 day   Jen Mow, DO 10/12/2015, 9:56 AM

## 2015-10-12 NOTE — Progress Notes (Signed)
UR chart review completed.  

## 2015-10-12 NOTE — Progress Notes (Signed)
I stopped by to check on patient's needs and ordered her lunch.  Robin Cortez Interpreter. °

## 2015-10-12 NOTE — Lactation Note (Signed)
This note was copied from a baby's chart. Lactation Consultation Note  Patient Name: Boy Tuongvi Varney MVHQI'O Date: 10/12/2015 Reason for consult: Initial assessment Initial visit made.  Breastfeeding consultation services and support information given to patient in Spanish.  Mom states baby is breastfeeding well and denies questions or concerns at present.  Encouraged to call with concerns/assist prn.  Maternal Data    Feeding    LATCH Score/Interventions                      Lactation Tools Discussed/Used     Consult Status      Huston Foley 10/12/2015, 11:40 AM

## 2015-10-13 MED ORDER — IBUPROFEN 600 MG PO TABS: 600.0000 mg | ORAL_TABLET | Freq: Four times a day (QID) | ORAL | 0 refills | Status: AC | PRN

## 2015-10-13 MED ORDER — DOCUSATE SODIUM 100 MG PO CAPS: 100.0000 mg | ORAL_CAPSULE | Freq: Two times a day (BID) | ORAL | 0 refills | Status: AC

## 2015-10-13 MED ORDER — OXYCODONE-ACETAMINOPHEN 5-325 MG PO TABS: 1.0000 | ORAL_TABLET | ORAL | 0 refills | Status: AC | PRN

## 2015-10-13 NOTE — Discharge Summary (Signed)
OB Discharge Summary     Patient Name: Robin Cortez DOB: June 22, 1985 MRN: [id number]  Date of admission: 10/11/2015 Delivering MD: Adam Phenix   Date of discharge: 10/13/2015  Admitting diagnosis: repeat c-section Intrauterine pregnancy: [redacted]w[redacted]d     Secondary diagnosis:  Active Problems:   Supervision of high risk pregnancy, antepartum   Gestational diabetes mellitus (GDM), antepartum   Large for gestational age fetus affecting mother, antepartum   Status post repeat low transverse cesarean section   Delivered by cesarean section  Additional problems: none     Discharge diagnosis: Term Pregnancy Delivered and GDM A1                                                                                                Post partum procedures:postpartum tubal ligation  Augmentation: none- scheduled CS  Complications: None  Hospital course:  Sceduled C/S   30 y.o. yo [id number] at [redacted]w[redacted]d was admitted to the hospital 10/11/2015 for scheduled cesarean section with the following indication:Elective Repeat.  Membrane Rupture Time/Date: 1:22 PM ,10/11/2015   Patient delivered a Viable infant.10/11/2015  Details of operation can be found in separate operative note.  Pateint had an uncomplicated postpartum course.  She is ambulating, tolerating a regular diet, passing flatus, and urinating well. Patient is discharged home in stable condition on  10/13/15          Physical exam Vitals:   10/12/15 0506 10/12/15 1201 10/12/15 1907 10/13/15 0615  BP: (!) 92/44 (!) 89/51 98/65 (!) 96/59  Pulse: 63 67 76 66  Resp: Temp: 97.8 F (36.6 C) 98.5 F (36.9 C) 98.6 F (37 C) 98.6 F (37 C)  TempSrc: Oral Oral Oral Oral  SpO2:      Weight:      Height:       General: alert, cooperative and no distress Lochia: appropriate Uterine Fundus: firm Incision: Healing well with no significant drainage DVT Evaluation: No evidence of DVT seen on physical exam. Labs: Lab Results  Component  Value Date   WBC 10.3 10/12/2015   HGB 8.9 (L) 10/12/2015   HCT 26.3 (L) 10/12/2015   MCV 86.2 10/12/2015   PLT 231 10/12/2015   CMP Latest Ref Rng & Units 10/10/2015  Glucose 65 - 99 mg/dL 88  BUN 6 - 20 mg/dL 8  Creatinine 6.57 - 8.46 mg/dL 9.62  Sodium 952 - 841 mmol/L 137  Potassium 3.5 - 5.1 mmol/L 3.6  Chloride 101 - 111 mmol/L 109  CO2 22 - 32 mmol/L 21(L)  Calcium 8.9 - 10.3 mg/dL 3.2(G)  Total Protein 6.0 - 8.3 g/dL -  Total Bilirubin 0.3 - 1.2 mg/dL -  Alkaline Phos 39 - 401 U/L -  AST 0 - 37 U/L -  ALT 0 - 35 U/L -    Discharge instruction: per After Visit Summary and "Baby and Me Booklet".  After visit meds:    Medication List    TAKE these medications   docusate sodium 100 MG capsule Commonly known as:  COLACE Take 1 capsule (100 mg total) by mouth  2 (two) times daily.   ibuprofen 600 MG tablet Commonly known as:  ADVIL,MOTRIN Take 1 tablet (600 mg total) by mouth every 6 (six) hours as needed for mild pain or moderate pain.   prenatal multivitamin Tabs tablet Take 1 tablet by mouth daily at 12 noon. Reported on 09/29/2015       Diet: routine diet  Activity: Advance as tolerated. Pelvic rest for 6 weeks.   Outpatient follow up:6 weeks Follow up Appt:No future appointments. Follow up Visit:No Follow-up on file.  Postpartum contraception: Tubal Ligation  Newborn Data: Live born female  Birth Weight: 9 lb 11.7 oz (4415 g) APGAR: 8, 9  Baby Feeding: Bottle and Breast Disposition:home with mother   10/13/2015 Loni Muse, MD

## 2015-10-13 NOTE — Discharge Instructions (Signed)
Cesarean Delivery, Care After  Refer to this sheet in the next few weeks. These instructions provide you with information on caring for yourself after your procedure. Your health care provider may also give you specific instructions. Your treatment has been planned according to current medical practices, but problems sometimes occur. Call your health care provider if you have any problems or questions after you go home.  HOME CARE INSTRUCTIONS   Only take over-the-counter or prescription medications as directed by your health care provider.   Do not drink alcohol, especially if you are breastfeeding or taking medication to relieve pain.   Do not chew or smoke tobacco.   Continue to use good perineal care. Good perineal care includes:    Wiping your perineum from front to back.    Keeping your perineum clean.   Check your surgical cut (incision) daily for increased redness, drainage, swelling, or separation of skin.   Clean your incision gently with soap and water every day, and then pat it dry. If your health care provider says it is okay, leave the incision uncovered. Use a bandage (dressing) if the incision is draining fluid or appears irritated. If the adhesive strips across the incision do not fall off within 7 days, carefully peel them off.   Hug a pillow when coughing or sneezing until your incision is healed. This helps to relieve pain.   Do not use tampons or douche until your health care provider says it is okay.   Shower, wash your hair, and take tub baths as directed by your health care provider.   Wear a well-fitting bra that provides breast support.   Limit wearing support panties or control-top hose.   Drink enough fluids to keep your urine clear or pale yellow.   Eat high-fiber foods such as whole grain cereals and breads, brown rice, beans, and fresh fruits and vegetables every day. These foods may help prevent or relieve constipation.   Resume activities such as climbing stairs,  driving, lifting, exercising, or traveling as directed by your health care provider.   Talk to your health care provider about resuming sexual activities. This is dependent upon your risk of infection, your rate of healing, and your comfort and desire to resume sexual activity.   Try to have someone help you with your household activities and your newborn for at least a few days after you leave the hospital.   Rest as much as possible. Try to rest or take a nap when your newborn is sleeping.   Increase your activities gradually.   Keep all of your scheduled postpartum appointments. It is very important to keep your scheduled follow-up appointments. At these appointments, your health care provider will be checking to make sure that you are healing physically and emotionally.  SEEK MEDICAL CARE IF:    You are passing large clots from your vagina. Save any clots to show your health care provider.   You have a foul smelling discharge from your vagina.   You have trouble urinating.   You are urinating frequently.   You have pain when you urinate.   You have a change in your bowel movements.   You have increasing redness, pain, or swelling near your incision.   You have pus draining from your incision.   Your incision is separating.   You have painful, hard, or reddened breasts.   You have a severe headache.   You have blurred vision or see spots.   You feel sad   or depressed.   You have thoughts of hurting yourself or your newborn.   You have questions about your care, the care of your newborn, or medications.   You are dizzy or light-headed.   You have a rash.   You have pain, redness, or swelling at the site of the removed intravenous access (IV) tube.   You have nausea or vomiting.   You stopped breastfeeding and have not had a menstrual period within 12 weeks of stopping.   You are not breastfeeding and have not had a menstrual period within 12 weeks of delivery.   You have a fever.  SEEK  IMMEDIATE MEDICAL CARE IF:   You have persistent pain.   You have chest pain.   You have shortness of breath.   You faint.   You have leg pain.   You have stomach pain.   Your vaginal bleeding saturates 2 or more sanitary pads in 1 hour.  MAKE SURE YOU:    Understand these instructions.   Will watch your condition.   Will get help right away if you are not doing well or get worse.     This information is not intended to replace advice given to you by your health care provider. Make sure you discuss any questions you have with your health care provider.     Document Released: 11/18/2001 Document Revised: 03/19/2014 Document Reviewed: 10/24/2011  Elsevier Interactive Patient Education 2016 [employer]vier Inc[employer]

## 2015-10-14 LAB — TYPE AND SCREEN
ABO/RH(D): O POS
ANTIBODY SCREEN: NEGATIVE
Unit division: 0
Unit division: 0

## 2015-10-28 ENCOUNTER — Encounter: Payer: Self-pay | Admitting: *Deleted

## 2015-12-22 ENCOUNTER — Encounter: Payer: Self-pay | Admitting: Family Medicine

## 2015-12-22 ENCOUNTER — Ambulatory Visit (INDEPENDENT_AMBULATORY_CARE_PROVIDER_SITE_OTHER): Payer: Self-pay | Admitting: Family Medicine

## 2015-12-22 NOTE — Progress Notes (Signed)
Subjective:     Robin Cortez is a 30 y.o. female who presents for a postpartum visit. She is 10 weeks postpartum following a low cervical transverse Cesarean section. I have fully reviewed the prenatal and intrapartum course. The delivery was at 39  gestational weeks. Outcome: repeat cesarean section, low transverse incision. Anesthesia: spinal. Postpartum course has been unremarkable. Baby's course has been unremarkable. Baby is feeding by both breast and bottle - Similac Advance. Bleeding red. Bowel function is normal. Bladder function is normal. Patient is sexually active. Contraception method is tubal ligation. Postpartum depression screening: negative. Spanish video interpreter "Robin Cortez"" [id number] used Robin Ramusna [phone]#[id number] The following portions of the patient's history were reviewed and updated as appropriate: allergies, current medications, past family history, past medical history, past social history, past surgical history and problem list.  Review of Systems Pertinent items noted in HPI and remainder of comprehensive ROS otherwise negative.   Objective:    BP 116/68   Pulse 70   Wt 176 lb (79.8 kg)   LMP 12/20/2015   Breastfeeding? Yes   BMI 32.19 kg/m   General:  alert, cooperative, appears stated age and no distress   Breasts:  inspection negative, no nipple discharge or bleeding, no masses or nodularity palpable  Lungs: clear to auscultation bilaterally  Heart:  regular rate and rhythm, S1, S2 normal, no murmur, click, rub or gallop  Abdomen: soft, non-tender; bowel sounds normal; no masses,  no organomegaly; Incision site is healed, clean, dry, intact.  Extremities: No edema  Neuro: CN 2-12 grossly intact.        Assessment:     10 week postpartum exam. Pap smear done 06/23/15, is negative.     Plan:    1. Contraception: tubal ligation 2. Gestational diabetes, to return when fasting for 2-hr GTT.  3. Follow up in: 1 week for GTT or as needed.

## 2015-12-22 NOTE — Patient Instructions (Signed)
Cuidados en el postparto luego de un parto por cesrea  (Postpartum Care After Cesarean Delivery) Despus del parto (perodo de postparto), la estada normal en el hospital es de 24-72 horas. Si hubo problemas con el trabajo de parto o el parto, o si tiene otros problemas mdicos, es posible que deba permanecer en el hospital por ms tiempo.  Mientras est en el hospital, recibir ayuda e instrucciones sobre cmo cuidar de usted misma y de su beb recin nacido durante el postparto.  Mientras est en el hospital:   Es normal que sienta dolor o molestias en la incisin en el abdomen. Asegrese de decirle a las enfermeras si siente dolor, as como donde siente el dolor y qu empeora el dolor.  Si est amamantando, puede sentir contracciones dolorosas en el tero durante algunas semanas. Esto es normal. Las contracciones ayudan a que el tero vuelva a su tamao normal.  Es normal tener algo de sangrado despus del parto.  Durante los primeros 1-3 das despus del parto, el flujo es de color rojo y la cantidad puede ser similar a un perodo.  Es frecuente que el flujo se inicie y se detenga.  En los primeros das, puede eliminar algunos cogulos pequeos. Informe a las enfermeras si comienza a eliminar cogulos grandes o aumenta el flujo.  No  elimine los cogulos de sangre por el inodoro antes de que la enfermera los vea.  Durante los prximos 3 a 10 das despus del parto, el flujo debe ser ms acuoso y rosado o marrn.  De diez a catorce das despus del parto, el flujo debe ser una pequea cantidad de secrecin de color blanco amarillento.  La cantidad de flujo disminuir en las primeras semanas despus del parto. El flujo puede detenerse en 6-8 semanas. La mayora de las mujeres no tienen ms flujo a las 12 semanas despus del parto.  Usted debe cambiar sus apsitos con frecuencia.  Lvese bien las manos con agua y jabn durante al menos 20 segundos despus de cambiar el apsito, usar el  bao o antes de sostener o alimentar a su recin nacido.  Se le quitar la va intravenosa (IV) cuando ya est bebiendo suficientes lquidos.  El tubo de drenaje para la orina (catter urinario) que se inserta antes del parto puede ser retirado luego de 6-8 horas despus del parto o cuando las piernas vuelvan a tener sensibilidad. Usted puede sentir que tiene que vaciar la vejiga durante las primeras 6-8 horas despus de que le quiten el catter.  Si se siente dbil, mareada o se desmaya, llame a su enfermera antes de levantarse de la cama por primera vez y antes de tomar una ducha por primera vez.  En los primeros das despus del parto, podr sentir las mamas sensibles y llenas. Esto se llama congestin. La sensibilidad en los senos por lo general desaparece dentro de las 48-72 horas despus de que ocurre la congestin. Tambin puede notar que la leche se escapa de sus senos. Si no est amamantando no estimule sus pechos. La estimulacin de las mamas hace que sus senos produzcan ms leche.  Pasar tanto tiempo como sea posible con el beb recin nacido es muy importante. Durante este tiempo, usted y su beb deben sentirse cerca y conocerse uno al otro. Tener al beb en su habitacin (alojamiento conjunto) ayudar a fortalecer el vnculo con el beb recin nacido. Esto le dar tiempo para conocerlo y atenderlo de manera cmoda.  Las hormonas se modifican despus del parto. A   veces, los cambios hormonales pueden causar tristeza o ganas de llorar por un tiempo. Estos sentimientos no deben durar ms de unos pocos das. Si duran ms que eso, debe hablar con su mdico.  Si lo desea, hable con su mdico acerca de los mtodos de planificacin familiar o mtodos anticonceptivos.  Hable con su mdico acerca de las vacunas. El mdico puede indicarle que se aplique las siguientes vacunas antes de salir del hospital:  Vacuna contra el ttanos, la difteria y la tos ferina (Tdap) o el ttanos y la difteria (Td).  Es muy importante que usted y su familia (incluyendo a los abuelos) u otras personas que cuidan al recin nacido estn al da con las vacunas Tdap o Td. Las vacunas Tdap o Td pueden ayudar a proteger al recin nacido de enfermedades.  Inmunizacin contra la rubola.  Inmunizacin contra la varicela.  Inmunizacin contra la gripe. Usted debe recibir esta vacunacin anual si no la ha recibido durante el embarazo.   Esta informacin no tiene como fin reemplazar el consejo del mdico. Asegrese de hacerle al mdico cualquier pregunta que tenga.   Document Released: 02/13/2012 Elsevier Interactive Patient Education 2016 Elsevier Inc.  

## 2015-12-26 ENCOUNTER — Other Ambulatory Visit: Payer: Self-pay

## 2017-06-30 IMAGING — US US MFM OB COMP +14 WKS
1 series · 14 of 28 positions shown · non-contrast
Comparison: none

[Series 1: us mfm ob comp +14 wks · 67 acquisitions, 14 frames shown]
[im 3/67]
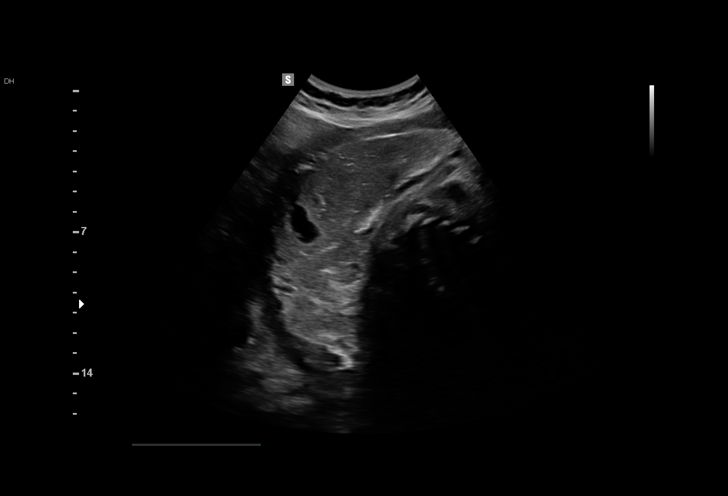
[im 8/67]
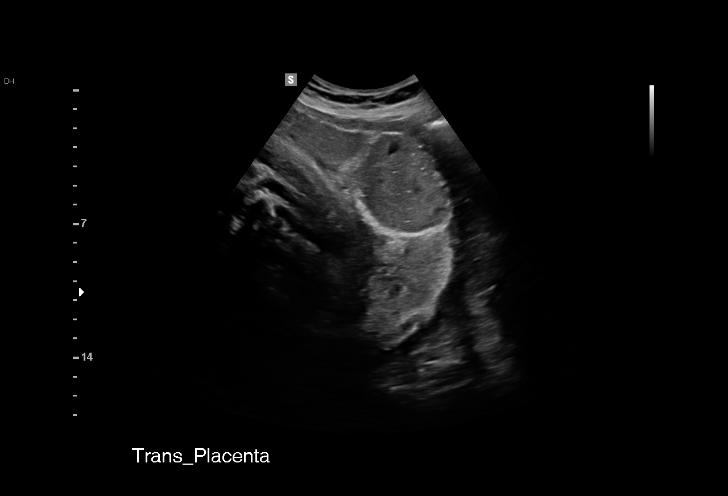
[im 13/67]
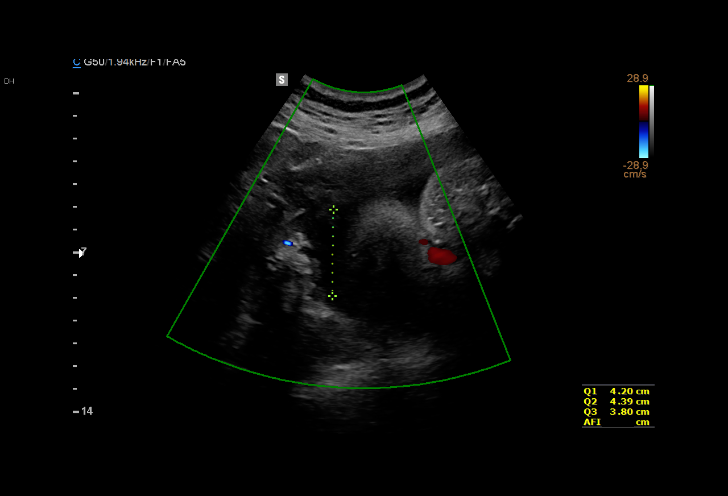
[im 18/67]
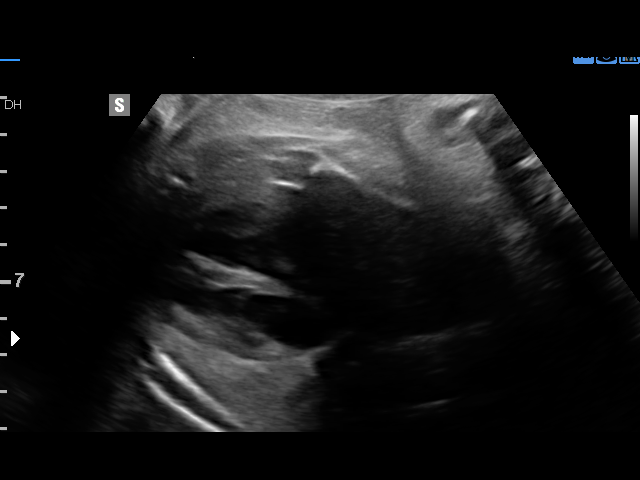
[im 23/67]
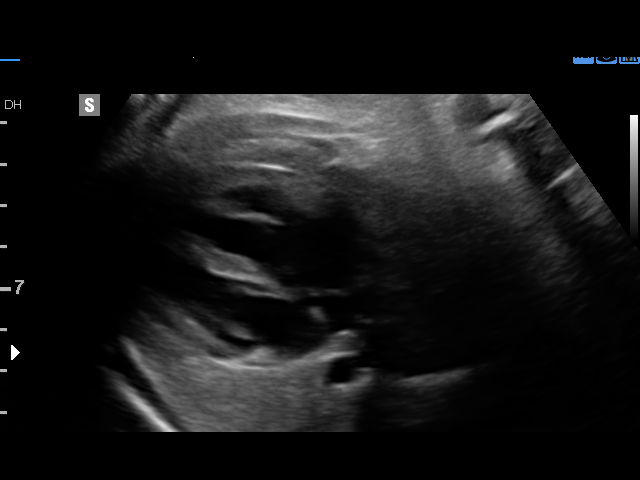
[im 27/67]
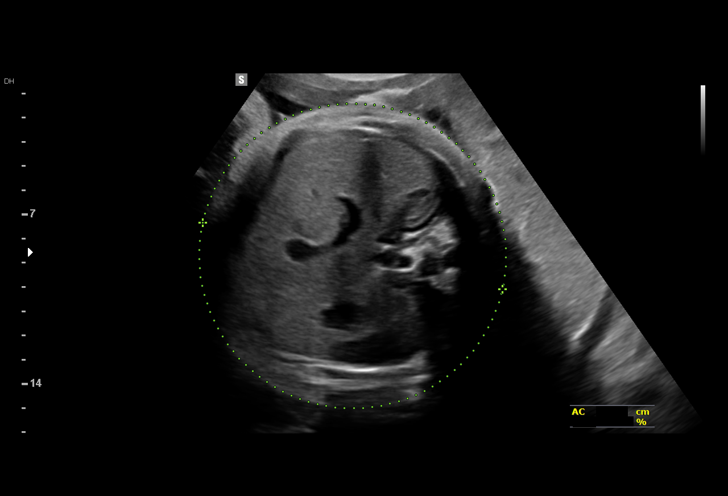
[im 32/67]
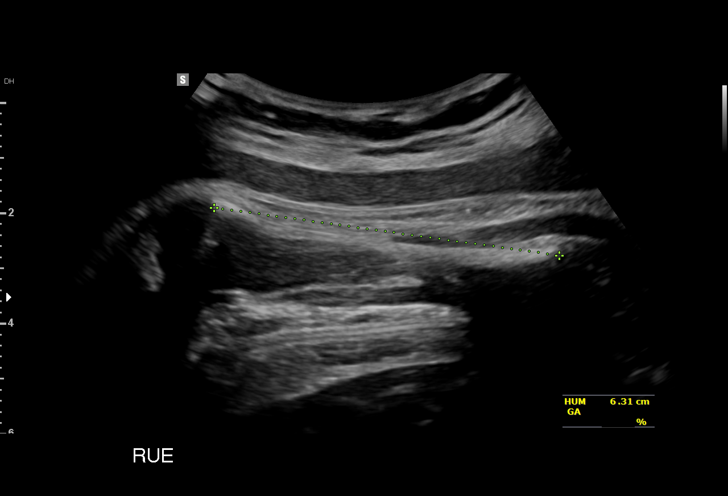
[im 37/67]
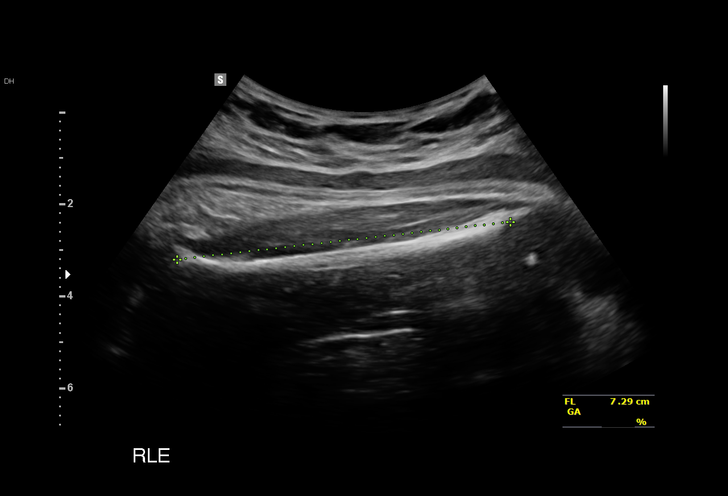
[im 42/67]
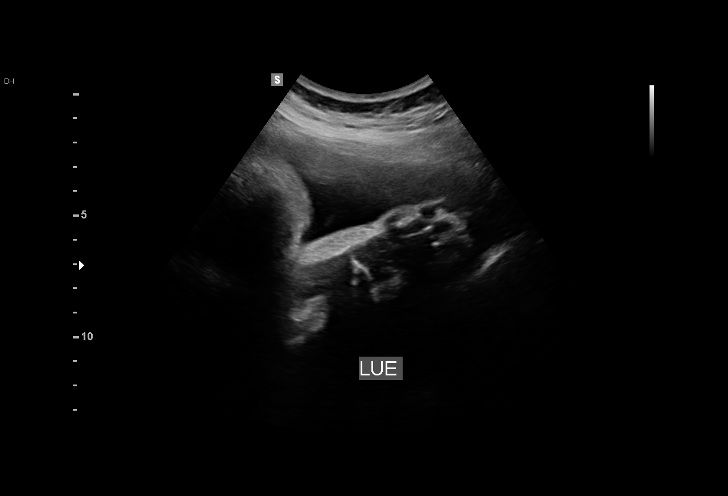
[im 47/67]
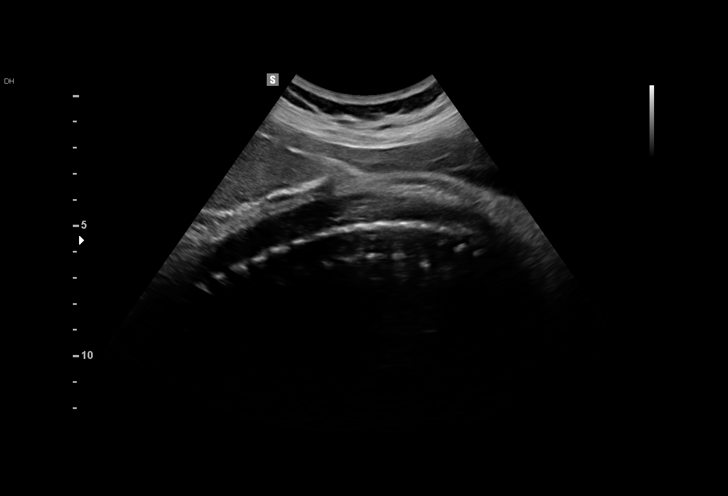
[im 52/67]
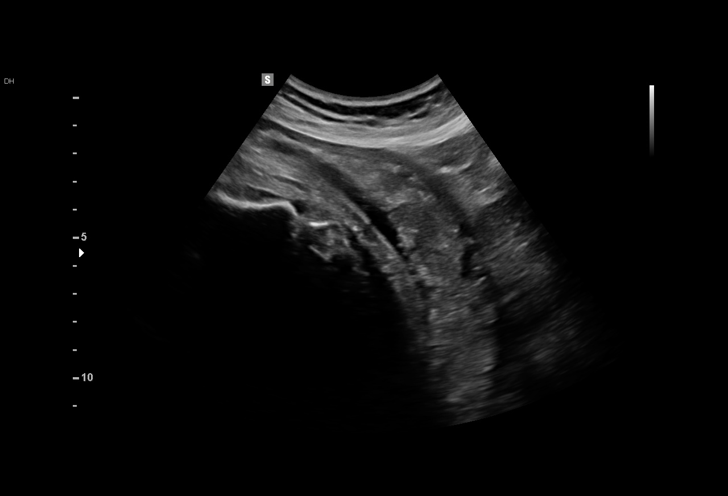
[im 57/67]
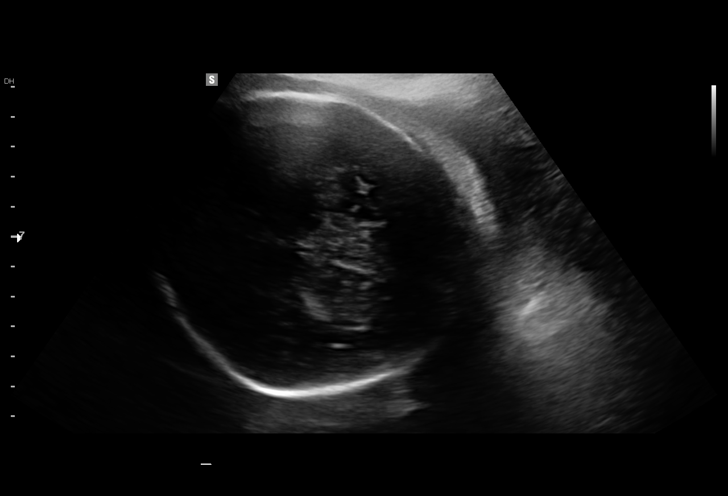
[im 62/67]
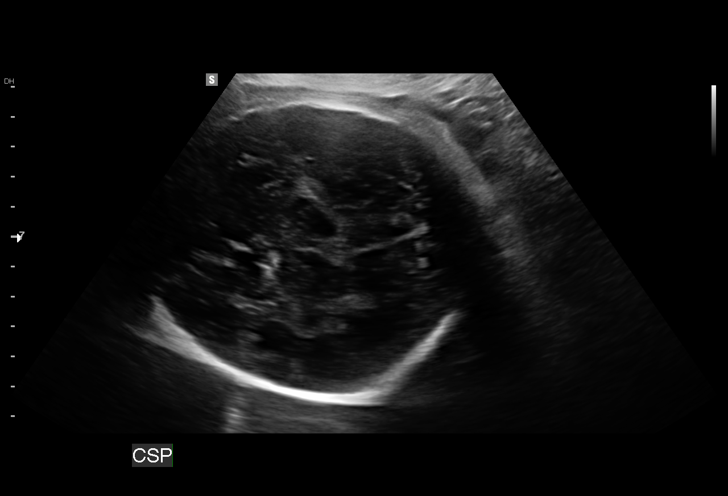
[im 67/67]
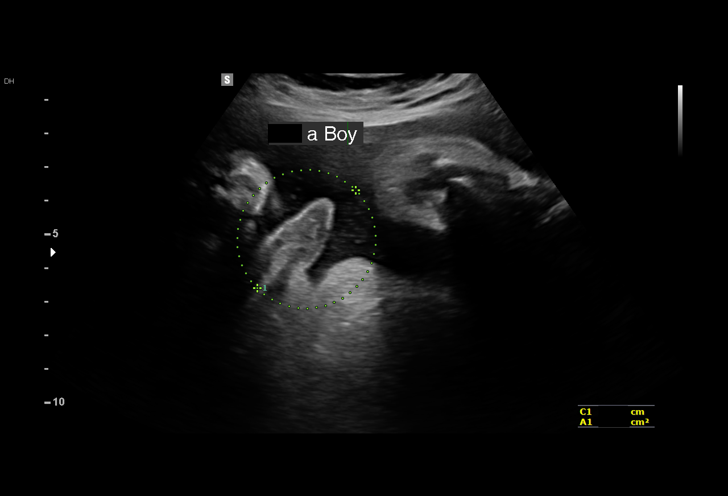

[14 of 28 positions shown; findings below may reference images not displayed]

OB/Gyn Clinic
[REDACTED]-
Faculty Physician

1  MALU FERNANDO            [id number]      [phone]     [id number]
Indications

38 weeks gestation of pregnancy
Basic anatomic survey                          Z36
Gestational diabetes in pregnancy, diet
controlled
Previous cesarean delivery x3, antepartum
Poor obstetric history: Prior fetal
macrosomia, antepartum
OB History

Gravidity:    4         Term:   3        Prem:   0        SAB:   0
TOP:          0       Ectopic:  0        Living: 3
Fetal Evaluation

Num Of Fetuses:     1
Fetal Heart         153
Rate(bpm):
Cardiac Activity:   Observed
Presentation:       Cephalic
Placenta:           Fundal, above cervical os
P. Cord Insertion:  Not well visualized

Amniotic Fluid
AFI FV:      Subjectively within normal limits

AFI Sum(cm)     %Tile       Largest Pocket(cm)
14.67           57
RUQ(cm)       RLQ(cm)       LUQ(cm)        LLQ(cm)
4.2
Biometry

BPD:      95.3  mm     G. Age:  38w 6d         85  %    CI:        81.02   %   70 - 86
FL/HC:      21.6   %   20.9 -
HC:      334.3  mm     G. Age:  38w 2d         29  %    HC/AC:      0.83       0.92 -
AC:       404   mm     G. Age:  N/A          > 97  %    FL/BPD:     75.9   %   71 - 87
FL:       72.3  mm     G. Age:  37w 0d         21  %    FL/AC:      17.9   %   20 - 24
HUM:      63.2  mm     G. Age:  36w 5d         43  %
CER:      50.9  mm     G. Age:  N/A          > 95  %

CM:        7.5  mm

Est. FW:    9985  gm    9 lb 14 oz    > 90  %
Gestational Age

LMP:           38w 3d       Date:   01/11/15                 EDD:   10/18/15
U/S Today:     38w 0d                                        EDD:   10/21/15
Best:          38w 3d    Det. By:   LMP  (01/11/15)          EDD:   10/18/15
Anatomy

Cranium:               Appears normal         Aortic Arch:            Not well visualized
Cavum:                 Not well visualized    Ductal Arch:            Not well visualized
Ventricles:            Appears normal         Diaphragm:              Appears normal
Choroid Plexus:        Appears normal         Stomach:                Appears normal, left
sided
Cerebellum:            Appears normal         Abdomen:                Appears normal
Posterior Fossa:       Appears normal         Abdominal Wall:         Not well visualized
Nuchal Fold:           Not applicable (>20    Cord Vessels:           Appears normal (3
wks GA)                                        vessel cord)
Face:                  Appears normal         Kidneys:                Appear normal
(orbits and profile)
Lips:                  Appears normal         Bladder:                Appears normal
Thoracic:              Appears normal         Spine:                  Appears normal
Heart:                 Appears normal         Upper Extremities:      Visualized
(4CH, axis, and
situs)
RVOT:                  Appears normal         Lower Extremities:      Visualized
LVOT:                  Appears normal

Other:  Fetus appears to be a male. Technically difficult due to advanced GA
and fetal position. Complete fetal anatomic survey previously
performed at [facility].
Cervix Uterus Adnexa

Cervix
Not visualized (advanced GA >67wks)

Adnexa:       No abnormality visualized.
Impression

Single IUP at 38w 3d
GDM, diet controlled; history of C-section x 3
Limited views of the fetal anatomy obtained due to advanced
gestational age.
No gross anomalies noted
The estimated fetal weight is > 90th %tile (9985 g)
Fundal placenta without previa
Normal amniotic fluid volume
Recommendations

Patient is scheduled for repeat C-section next week
Follow-up ultrasounds as clinically indicated.
